# Patient Record
Sex: Female | Born: 1985 | Race: Black or African American | Hispanic: No | Marital: Married | Smoking: Never smoker
Health system: Southern US, Community
[De-identification: ages and names within clinical notes are randomized; demographics above are authoritative.]

## PROBLEM LIST (undated history)

## (undated) ENCOUNTER — Inpatient Hospital Stay (HOSPITAL_COMMUNITY): Payer: Self-pay

## (undated) DIAGNOSIS — N390 Urinary tract infection, site not specified: Secondary | ICD-10-CM

## (undated) DIAGNOSIS — F329 Major depressive disorder, single episode, unspecified: Secondary | ICD-10-CM

## (undated) DIAGNOSIS — N9489 Other specified conditions associated with female genital organs and menstrual cycle: Secondary | ICD-10-CM

## (undated) DIAGNOSIS — Z789 Other specified health status: Secondary | ICD-10-CM

## (undated) DIAGNOSIS — D649 Anemia, unspecified: Secondary | ICD-10-CM

## (undated) DIAGNOSIS — F419 Anxiety disorder, unspecified: Secondary | ICD-10-CM

## (undated) DIAGNOSIS — R42 Dizziness and giddiness: Secondary | ICD-10-CM

## (undated) DIAGNOSIS — F32A Depression, unspecified: Secondary | ICD-10-CM

## (undated) HISTORY — DX: Urinary tract infection, site not specified: N39.0

## (undated) HISTORY — PX: NO PAST SURGERIES: SHX2092

## (undated) HISTORY — DX: Depression, unspecified: F32.A

## (undated) HISTORY — DX: Dizziness and giddiness: R42

## (undated) HISTORY — DX: Anemia, unspecified: D64.9

## (undated) HISTORY — DX: Anxiety disorder, unspecified: F41.9

---
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
## 2013-09-06 ENCOUNTER — Emergency Department (HOSPITAL_COMMUNITY)
Admission: EM | Admit: 2013-09-06 | Discharge: 2013-09-07 | Disposition: A | Discharge status: Home / Self Care | Payer: Medicaid Other | Attending: Emergency Medicine | Admitting: Emergency Medicine

## 2013-09-06 ENCOUNTER — Emergency Department (HOSPITAL_COMMUNITY): Payer: Medicaid Other

## 2013-09-06 ENCOUNTER — Encounter (HOSPITAL_COMMUNITY): Payer: Self-pay | Admitting: Emergency Medicine

## 2013-09-06 DIAGNOSIS — O9989 Other specified diseases and conditions complicating pregnancy, childbirth and the puerperium: Secondary | ICD-10-CM | POA: Insufficient documentation

## 2013-09-06 DIAGNOSIS — R0602 Shortness of breath: Secondary | ICD-10-CM | POA: Insufficient documentation

## 2013-09-06 DIAGNOSIS — K219 Gastro-esophageal reflux disease without esophagitis: Secondary | ICD-10-CM | POA: Insufficient documentation

## 2013-09-06 DIAGNOSIS — R079 Chest pain, unspecified: Secondary | ICD-10-CM | POA: Insufficient documentation

## 2013-09-06 LAB — CBC
HCT: 35.1 % — ABNORMAL LOW (ref 36.0–46.0)
Hemoglobin: 12.1 g/dL (ref 12.0–15.0)
MCH: 30.4 pg (ref 26.0–34.0)
MCHC: 34.5 g/dL (ref 30.0–36.0)
MCV: 88.2 fL (ref 78.0–100.0)
PLATELETS: 228 10*3/uL (ref 150–400)
RBC: 3.98 MIL/uL (ref 3.87–5.11)
RDW: 11.9 % (ref 11.5–15.5)
WBC: 6.5 10*3/uL (ref 4.0–10.5)

## 2013-09-06 LAB — URINALYSIS, ROUTINE W REFLEX MICROSCOPIC
Bilirubin Urine: NEGATIVE
GLUCOSE, UA: NEGATIVE mg/dL
Hgb urine dipstick: NEGATIVE
KETONES UR: NEGATIVE mg/dL
Nitrite: NEGATIVE
Protein, ur: NEGATIVE mg/dL
Specific Gravity, Urine: 1.02 (ref 1.005–1.030)
Urobilinogen, UA: 1 mg/dL (ref 0.0–1.0)
pH: 7 (ref 5.0–8.0)

## 2013-09-06 LAB — BASIC METABOLIC PANEL
BUN: 9 mg/dL (ref 6–23)
CALCIUM: 9.7 mg/dL (ref 8.4–10.5)
CO2: 22 meq/L (ref 19–32)
CREATININE: 0.62 mg/dL (ref 0.50–1.10)
Chloride: 103 mEq/L (ref 96–112)
GFR calc non Af Amer: >90 mL/min (ref >90)
Glucose, Bld: 86 mg/dL (ref 70–99)
Potassium: 3.8 mEq/L (ref 3.7–5.3)
Sodium: 138 mEq/L (ref 137–147)

## 2013-09-06 LAB — LIPASE, BLOOD: Lipase: 26 U/L (ref 11–59)

## 2013-09-06 LAB — HEPATIC FUNCTION PANEL
ALT: 13 U/L (ref 0–35)
AST: 15 U/L (ref 0–37)
Albumin: 3.7 g/dL (ref 3.5–5.2)
Alkaline Phosphatase: 36 U/L — ABNORMAL LOW (ref 39–117)
Total Bilirubin: 0.3 mg/dL (ref 0.3–1.2)
Total Protein: 7 g/dL (ref 6.0–8.3)

## 2013-09-06 LAB — URINE MICROSCOPIC-ADD ON

## 2013-09-06 LAB — I-STAT TROPONIN, ED: Troponin i, poc: 0 ng/mL (ref 0.00–0.08)

## 2013-09-06 LAB — POC URINE PREG, ED: Preg Test, Ur: POSITIVE — AB

## 2013-09-06 MED ORDER — ASPIRIN 81 MG PO CHEW: 324.0000 mg | CHEWABLE_TABLET | Freq: Once | ORAL | Status: DC

## 2013-09-06 NOTE — ED Provider Notes (Signed)
CSN: [id number]     Arrival date & time 09/06/13  2111 History   First MD Initiated Contact with Patient 09/06/13 2132     Chief Complaint  Patient presents with  . Chest Pain    Patient is a 28 y.o. female presenting with chest pain. The history is provided by the patient.  Chest Pain Pain location:  Substernal area Pain quality: sharp   Pain radiates to:  L arm Pain severity:  Moderate Onset quality:  Sudden Duration:  2 hours Timing:  Constant Context: breathing   Relieved by:  Nothing Worsened by:  Nothing tried Ineffective treatments:  None tried Associated symptoms: shortness of breath   Associated symptoms: no abdominal pain, no cough and no fever   Risk factors: no birth control, no hypertension, no prior DVT/PE and no surgery   Pt has had a recent home pregnancy test that was positive.  History reviewed. No pertinent past medical history. History reviewed. No pertinent past surgical history. No family history on file. History  Substance Use Topics  . Smoking status: Never Smoker   . Smokeless tobacco: Never Used  . Alcohol Use: Yes     Comment: wine   OB History   Grav Para Term Preterm Abortions TAB SAB Ect Mult Living                 Review of Systems  Constitutional: Negative for fever.  Respiratory: Positive for shortness of breath. Negative for cough.   Cardiovascular: Positive for chest pain.  Gastrointestinal: Negative for abdominal pain.  All other systems reviewed and are negative.     Allergies  Review of patient's allergies indicates not on file.  Home Medications   Prior to Admission medications   Not on File   BP 95/59  Pulse 89  Resp 15  SpO2 100%  LMP 08/04/2013 Physical Exam  Nursing note and vitals reviewed. Constitutional: She appears well-developed and well-nourished. No distress.  HENT:  Head: Normocephalic and atraumatic.  Right Ear: External ear normal.  Left Ear: External ear normal.  Eyes: Conjunctivae are normal.  Right eye exhibits no discharge. Left eye exhibits no discharge. No scleral icterus.  Neck: Neck supple. No tracheal deviation present.  Cardiovascular: Normal rate, regular rhythm and intact distal pulses.   Pulmonary/Chest: Effort normal and breath sounds normal. No stridor. No respiratory distress. She has no wheezes. She has no rales. She exhibits tenderness.  Abdominal: Soft. Bowel sounds are normal. She exhibits no distension. There is tenderness in the epigastric area. There is no rebound and no guarding.  mild  Musculoskeletal: She exhibits no edema and no tenderness.  Neurological: She is alert. She has normal strength. No cranial nerve deficit (no facial droop, extraocular movements intact, no slurred speech) or sensory deficit. She exhibits normal muscle tone. She displays no seizure activity. Coordination normal.  Skin: Skin is warm and dry. No rash noted.  Psychiatric: She has a normal mood and affect.    ED Course  Procedures (including critical care time) Labs Review Labs Reviewed  CBC  BASIC METABOLIC PANEL  I-STAT TROPOININ, ED  POC URINE PREG, ED    Imaging Review No results found.   EKG Interpretation   Date/Time:  Wednesday Sep 06 2013 21:21:37 EDT Ventricular Rate:  93 PR Interval:  117 QRS Duration: 70 QT Interval:  350 QTC Calculation: 435 R Axis:   80 Text Interpretation:  Sinus rhythm Borderline short PR interval No  previous tracing Confirmed by Colston Pyle  MD-J, Jaken Fregia ([id number]) on 09/06/2013  9:33:10 PM      MDM   Final diagnoses:  Chest pain  GERD (gastroesophageal reflux disease)    Mild ttp in chest and epigastrum.  ?GERD.  Doubt PE, cardiac etiology.  Will dc home with antacids.  Follow up with PCP   Celene KrasJon R Keyshon Stein, MD 09/07/13 [phone]

## 2013-09-06 NOTE — ED Notes (Signed)
Bed: WA18 Expected date:  Expected time:  Means of arrival:  Comments: Triage 1 

## 2013-09-06 NOTE — ED Notes (Signed)
Pt states she is pregnant.

## 2013-09-06 NOTE — ED Notes (Signed)
Pt states started having mid sternal to L sided chest pain around 1930 tonight, radiation to L arm, states also having shortness of breath, denies any other symptoms.

## 2013-09-07 MED ORDER — FAMOTIDINE 20 MG PO TABS: 20.0000 mg | ORAL_TABLET | Freq: Two times a day (BID) | ORAL | Status: DC

## 2013-09-07 NOTE — Discharge Instructions (Signed)
Chest Pain (Nonspecific) °It is often hard to give a specific diagnosis for the cause of chest pain. There is always a chance that your pain could be related to something serious, such as a heart attack or a blood clot in the lungs. You need to follow up with your caregiver for further evaluation. °CAUSES  °· Heartburn. °· Pneumonia or bronchitis. °· Anxiety or stress. °· Inflammation around your heart (pericarditis) or lung (pleuritis or pleurisy). °· A blood clot in the lung. °· A collapsed lung (pneumothorax). It can develop suddenly on its own (spontaneous pneumothorax) or from injury (trauma) to the chest. °· Shingles infection (herpes zoster virus). °The chest wall is composed of bones, muscles, and cartilage. Any of these can be the source of the pain. °· The bones can be bruised by injury. °· The muscles or cartilage can be strained by coughing or overwork. °· The cartilage can be affected by inflammation and become sore (costochondritis). °DIAGNOSIS  °Lab tests or other studies, such as X-rays, electrocardiography, stress testing, or cardiac imaging, may be needed to find the cause of your pain.  °TREATMENT  °· Treatment depends on what may be causing your chest pain. Treatment may include: °· Acid blockers for heartburn. °· Anti-inflammatory medicine. °· Pain medicine for inflammatory conditions. °· Antibiotics if an infection is present. °· You may be advised to change lifestyle habits. This includes stopping smoking and avoiding alcohol, caffeine, and chocolate. °· You may be advised to keep your head raised (elevated) when sleeping. This reduces the chance of acid going backward from your stomach into your esophagus. °· Most of the time, nonspecific chest pain will improve within 2 to 3 days with rest and mild pain medicine. °HOME CARE INSTRUCTIONS  °· If antibiotics were prescribed, take your antibiotics as directed. Finish them even if you start to feel better. °· For the next few days, avoid physical  activities that bring on chest pain. Continue physical activities as directed. °· Do not smoke. °· Avoid drinking alcohol. °· Only take over-the-counter or prescription medicine for pain, discomfort, or fever as directed by your caregiver. °· Follow your caregiver's suggestions for further testing if your chest pain does not go away. °· Keep any follow-up appointments you made. If you do not go to an appointment, you could develop lasting (chronic) problems with pain. If there is any problem keeping an appointment, you must call to reschedule. °SEEK MEDICAL CARE IF:  °· You think you are having problems from the medicine you are taking. Read your medicine instructions carefully. °· Your chest pain does not go away, even after treatment. °· You develop a rash with blisters on your chest. °SEEK IMMEDIATE MEDICAL CARE IF:  °· You have increased chest pain or pain that spreads to your arm, neck, jaw, back, or abdomen. °· You develop shortness of breath, an increasing cough, or you are coughing up blood. °· You have severe back or abdominal pain, feel nauseous, or vomit. °· You develop severe weakness, fainting, or chills. °· You have a fever. °THIS IS AN EMERGENCY. Do not wait to see if the pain will go away. Get medical help at once. Call your local emergency services (911 in [place] Do not drive yourself to the hospital. °MAKE SURE YOU:  °· Understand these instructions. °· Will watch your condition. °· Will get help right away if you are not doing well or get worse. °Document Released: 01/21/2005 Document Revised: 07/06/2011 Document Reviewed: 11/17/2007 °ExitCare® Patient Information ©2014 [employer]   [employer]. ° °

## 2014-02-19 ENCOUNTER — Encounter (HOSPITAL_COMMUNITY): Payer: Self-pay | Admitting: General Practice

## 2014-02-19 ENCOUNTER — Inpatient Hospital Stay (HOSPITAL_COMMUNITY)
Admission: AD | Admit: 2014-02-19 | Discharge: 2014-02-19 | Disposition: A | Discharge status: Home / Self Care | Payer: Medicaid Other | Source: Ambulatory Visit | Attending: Obstetrics and Gynecology | Admitting: Obstetrics and Gynecology

## 2014-02-19 ENCOUNTER — Inpatient Hospital Stay (HOSPITAL_COMMUNITY): Payer: Medicaid Other

## 2014-02-19 DIAGNOSIS — O26899 Other specified pregnancy related conditions, unspecified trimester: Secondary | ICD-10-CM

## 2014-02-19 DIAGNOSIS — O418X10 Other specified disorders of amniotic fluid and membranes, first trimester, not applicable or unspecified: Secondary | ICD-10-CM

## 2014-02-19 DIAGNOSIS — O9989 Other specified diseases and conditions complicating pregnancy, childbirth and the puerperium: Secondary | ICD-10-CM

## 2014-02-19 DIAGNOSIS — R109 Unspecified abdominal pain: Secondary | ICD-10-CM | POA: Insufficient documentation

## 2014-02-19 DIAGNOSIS — O368910 Maternal care for other specified fetal problems, first trimester, not applicable or unspecified: Secondary | ICD-10-CM | POA: Diagnosis not present

## 2014-02-19 DIAGNOSIS — O468X1 Other antepartum hemorrhage, first trimester: Secondary | ICD-10-CM

## 2014-02-19 HISTORY — DX: Other specified health status: Z78.9

## 2014-02-19 LAB — WET PREP, GENITAL
Clue Cells Wet Prep HPF POC: NONE SEEN
TRICH WET PREP: NONE SEEN
YEAST WET PREP: NONE SEEN

## 2014-02-19 LAB — CBC
HCT: 33.9 % — ABNORMAL LOW (ref 36.0–46.0)
Hemoglobin: 11.8 g/dL — ABNORMAL LOW (ref 12.0–15.0)
MCH: 30.6 pg (ref 26.0–34.0)
MCHC: 34.8 g/dL (ref 30.0–36.0)
MCV: 87.8 fL (ref 78.0–100.0)
PLATELETS: 210 10*3/uL (ref 150–400)
RBC: 3.86 MIL/uL — AB (ref 3.87–5.11)
RDW: 12.2 % (ref 11.5–15.5)
WBC: 7 10*3/uL (ref 4.0–10.5)

## 2014-02-19 LAB — URINALYSIS, ROUTINE W REFLEX MICROSCOPIC
BILIRUBIN URINE: NEGATIVE
Glucose, UA: NEGATIVE mg/dL
Hgb urine dipstick: NEGATIVE
Ketones, ur: NEGATIVE mg/dL
LEUKOCYTES UA: NEGATIVE
NITRITE: NEGATIVE
PH: 8.5 — AB (ref 5.0–8.0)
Protein, ur: NEGATIVE mg/dL
Specific Gravity, Urine: 1.02 (ref 1.005–1.030)
Urobilinogen, UA: 0.2 mg/dL (ref 0.0–1.0)

## 2014-02-19 LAB — ABO/RH: ABO/RH(D): A NEG

## 2014-02-19 LAB — HCG, QUANTITATIVE, PREGNANCY: HCG, BETA CHAIN, QUANT, S: 186300 m[IU]/mL — AB (ref <5)

## 2014-02-19 LAB — POCT PREGNANCY, URINE: PREG TEST UR: POSITIVE — AB

## 2014-02-19 NOTE — MAU Provider Note (Signed)
History     CSN: [id number]  Arrival date and time: 02/19/14 1734   First Provider Initiated Contact with Patient 02/19/14 1852      Chief Complaint  Patient presents with  . Abdominal Pain   HPI  Ms. Michele Arias is a 28 y.o.female G3P1011 at [redacted]w[redacted]d who presents with left sided abdominal pain that started yesterday; the pain is constant today. She is scheduled on Oct 23 to see Janene Harvey She denies vaginal bleeding.   OB History   Grav Para Term Preterm Abortions TAB SAB Ect Mult Living   3 1 1  1 1    1       Past Medical History  Diagnosis Date  . Medical history non-contributory     Past Surgical History  Procedure Laterality Date  . No past surgeries      History reviewed. No pertinent family history.  History  Substance Use Topics  . Smoking status: Never Smoker   . Smokeless tobacco: Never Used  . Alcohol Use: Yes     Comment: wine    Allergies: No Known Allergies  Prescriptions prior to admission  Medication Sig Dispense Refill  . acetaminophen (TYLENOL) 325 MG tablet Take 650 mg by mouth every 6 (six) hours as needed for headache.      . prenatal vitamin w/FE, FA (NATACHEW) 29-1 MG CHEW chewable tablet Chew 1 tablet by mouth daily at 12 noon.      . [DISCONTINUED] aspirin-acetaminophen-caffeine (EXCEDRIN MIGRAINE) 250-250-65 MG per tablet Take 1 tablet by mouth every 6 (six) hours as needed for headache.      . [DISCONTINUED] famotidine (PEPCID) 20 MG tablet Take 1 tablet (20 mg total) by mouth 2 (two) times daily.  14 tablet  0   Results for orders placed during the hospital encounter of 02/19/14 (from the past 48 hour(s))  URINALYSIS, ROUTINE W REFLEX MICROSCOPIC     Status: Abnormal   Collection Time    02/19/14  5:50 PM      Result Value Ref Range   Color, Urine YELLOW  YELLOW   APPearance CLEAR  CLEAR   Specific Gravity, Urine 1.020  1.005 - 1.030   pH 8.5 (*) 5.0 - 8.0   Glucose, UA NEGATIVE  NEGATIVE mg/dL   Hgb urine dipstick  NEGATIVE  NEGATIVE   Bilirubin Urine NEGATIVE  NEGATIVE   Ketones, ur NEGATIVE  NEGATIVE mg/dL   Protein, ur NEGATIVE  NEGATIVE mg/dL   Urobilinogen, UA 0.2  0.0 - 1.0 mg/dL   Nitrite NEGATIVE  NEGATIVE   Leukocytes, UA NEGATIVE  NEGATIVE   Comment: MICROSCOPIC NOT DONE ON URINES WITH NEGATIVE PROTEIN, BLOOD, LEUKOCYTES, NITRITE, OR GLUCOSE <1000 mg/dL.  POCT PREGNANCY, URINE     Status: Abnormal   Collection Time    02/19/14  6:15 PM      Result Value Ref Range   Preg Test, Ur POSITIVE (*) NEGATIVE   Comment:            THE SENSITIVITY OF THIS     METHODOLOGY IS >24 mIU/mL  ABO/RH     Status: None   Collection Time    02/19/14  6:30 PM      Result Value Ref Range   ABO/RH(D) A NEG    CBC     Status: Abnormal   Collection Time    02/19/14  6:52 PM      Result Value Ref Range   WBC 7.0  4.0 - 10.5 K/uL  RBC 3.86 (*) 3.87 - 5.11 MIL/uL   Hemoglobin 11.8 (*) 12.0 - 15.0 g/dL   HCT 14.7 (*) 82.9 - 56.2 %   MCV 87.8  78.0 - 100.0 fL   MCH 30.6  26.0 - 34.0 pg   MCHC 34.8  30.0 - 36.0 g/dL   RDW 13.0  86.5 - 78.4 %   Platelets 210  150 - 400 K/uL  HCG, QUANTITATIVE, PREGNANCY     Status: Abnormal   Collection Time    02/19/14  6:52 PM      Result Value Ref Range   hCG, Beta Chain, Quant, S [id number] (*) <5 mIU/mL   Comment:              GEST. AGE      CONC.  (mIU/mL)       <=1 WEEK        5 - 50         2 WEEKS       50 - 500         3 WEEKS       100 - 10,000         4 WEEKS     1,000 - 30,000         5 WEEKS     3,500 - 115,000       6-8 WEEKS     12,000 - 270,000        12 WEEKS     15,000 - 220,000                FEMALE AND NON-PREGNANT FEMALE:         LESS THAN 5 mIU/mL  WET PREP, GENITAL     Status: Abnormal   Collection Time    02/19/14  6:58 PM      Result Value Ref Range   Yeast Wet Prep HPF POC NONE SEEN  NONE SEEN   Trich, Wet Prep NONE SEEN  NONE SEEN   Clue Cells Wet Prep HPF POC NONE SEEN  NONE SEEN   WBC, Wet Prep HPF POC MANY (*) NONE SEEN   Comment:  MANY BACTERIA SEEN    Review of Systems  Constitutional: Negative for fever and chills.  Gastrointestinal: Positive for nausea and diarrhea. Negative for vomiting and constipation.  Genitourinary: Negative for dysuria, urgency, frequency and hematuria.       + vaginal discharge; white, no odor  No vaginal bleeding. No dysuria.    Physical Exam   Blood pressure 110/66, pulse 94, temperature 98.2 F (36.8 C), temperature source Oral, resp. rate 18, height 5\' 5"  (1.651 m), weight 52.527 kg (115 lb 12.8 oz), last menstrual period 12/27/2013.  Physical Exam  Constitutional: She is oriented to person, place, and time. She appears well-developed and well-nourished. No distress.  HENT:  Head: Normocephalic.  Eyes: Pupils are equal, round, and reactive to light.  Neck: Neck supple.  Respiratory: Effort normal.  GI: Normal appearance. There is tenderness in the suprapubic area. There is no rigidity and no guarding.  Genitourinary:  Speculum exam: Vagina - Small amount of creamy discharge, no odor Cervix - No contact bleeding Bimanual exam: Cervix closed Uterus non tender, normal size Adnexa non tender, no masses bilaterally, +suprapubic tenderness  GC/Chlam, wet prep done Chaperone present for exam.   Musculoskeletal: Normal range of motion.  Neurological: She is alert and oriented to person, place, and time.  Skin: She is not diaphoretic.  Psychiatric: Her behavior is normal.  MAU Course  Procedures None  MDM CBC ABO Beta hcg US  UA UPT  1940: patient awaiting US.  A negative blood type; denies vaginal bleeding Report given to Thressa ShellerHeather Opel Lejeune CNM who resumes care of this patient.   Results for orders placed during the hospital encounter of 02/19/14 (from the past 24 hour(s))  URINALYSIS, ROUTINE W REFLEX MICROSCOPIC     Status: Abnormal   Collection Time    02/19/14  5:50 PM      Result Value Ref Range   Color, Urine YELLOW  YELLOW   APPearance CLEAR  CLEAR    Specific Gravity, Urine 1.020  1.005 - 1.030   pH 8.5 (*) 5.0 - 8.0   Glucose, UA NEGATIVE  NEGATIVE mg/dL   Hgb urine dipstick NEGATIVE  NEGATIVE   Bilirubin Urine NEGATIVE  NEGATIVE   Ketones, ur NEGATIVE  NEGATIVE mg/dL   Protein, ur NEGATIVE  NEGATIVE mg/dL   Urobilinogen, UA 0.2  0.0 - 1.0 mg/dL   Nitrite NEGATIVE  NEGATIVE   Leukocytes, UA NEGATIVE  NEGATIVE  POCT PREGNANCY, URINE     Status: Abnormal   Collection Time    02/19/14  6:15 PM      Result Value Ref Range   Preg Test, Ur POSITIVE (*) NEGATIVE  ABO/RH     Status: None   Collection Time    02/19/14  6:30 PM      Result Value Ref Range   ABO/RH(D) A NEG    CBC     Status: Abnormal   Collection Time    02/19/14  6:52 PM      Result Value Ref Range   WBC 7.0  4.0 - 10.5 K/uL   RBC 3.86 (*) 3.87 - 5.11 MIL/uL   Hemoglobin 11.8 (*) 12.0 - 15.0 g/dL   HCT 21.333.9 (*) 08.636.0 - 57.846.0 %   MCV 87.8  78.0 - 100.0 fL   MCH 30.6  26.0 - 34.0 pg   MCHC 34.8  30.0 - 36.0 g/dL   RDW 46.912.2  62.911.5 - 52.815.5 %   Platelets 210  150 - 400 K/uL  HCG, QUANTITATIVE, PREGNANCY     Status: Abnormal   Collection Time    02/19/14  6:52 PM      Result Value Ref Range   hCG, Beta Chain, Quant, [place] [id number] (*) <5 mIU/mL  WET PREP, GENITAL     Status: Abnormal   Collection Time    02/19/14  6:58 PM      Result Value Ref Range   Yeast Wet Prep HPF POC NONE SEEN  NONE SEEN   Trich, Wet Prep NONE SEEN  NONE SEEN   Clue Cells Wet Prep HPF POC NONE SEEN  NONE SEEN   WBC, Wet Prep HPF POC MANY (*) NONE SEEN   [place] Ob Comp Less 14 Wks  02/19/2014   CLINICAL DATA:  Left lower quadrant abdominal pain, pregnancy.  EXAM: OBSTETRIC <14 WK US AND TRANSVAGINAL OB US  TECHNIQUE: Both transabdominal and transvaginal ultrasound examinations were performed for complete evaluation of the gestation as well as the maternal uterus, adnexal regions, and pelvic cul-de-sac. Transvaginal technique was performed to assess early pregnancy.  COMPARISON:  None.  FINDINGS:  Intrauterine gestational sac: Visualized/normal in shape.  Yolk sac:  Visualized.  Embryo:  Visualized.  Cardiac Activity: Visualized.  Heart Rate:  168 bpm  CRL:   17.6  mm   8 w 2 d  US EDC: September 29, 2014.  Maternal uterus/adnexae: Ovaries appear normal. No free fluid is noted. Moderate size subchorionic hemorrhage is noted.  IMPRESSION: Single live intrauterine gestation of 8 weeks 2 days. Moderate size subchorionic hemorrhage is present.   Electronically Signed   By: Roque LiasJames  Green M.D.   On: 02/19/2014 21:00   [place] Ob Transvaginal  02/19/2014   CLINICAL DATA:  Left lower quadrant abdominal pain, pregnancy.  EXAM: OBSTETRIC <14 WK US AND TRANSVAGINAL OB US  TECHNIQUE: Both transabdominal and transvaginal ultrasound examinations were performed for complete evaluation of the gestation as well as the maternal uterus, adnexal regions, and pelvic cul-de-sac. Transvaginal technique was performed to assess early pregnancy.  COMPARISON:  None.  FINDINGS: Intrauterine gestational sac: Visualized/normal in shape.  Yolk sac:  Visualized.  Embryo:  Visualized.  Cardiac Activity: Visualized.  Heart Rate:  168 bpm  CRL:   17.6  mm   8 w 2 d                  US EDC: September 29, 2014.  Maternal uterus/adnexae: Ovaries appear normal. No free fluid is noted. Moderate size subchorionic hemorrhage is noted.  IMPRESSION: Single live intrauterine gestation of 8 weeks 2 days. Moderate size subchorionic hemorrhage is present.   Electronically Signed   By: Roque LiasJames  Green M.D.   On: 02/19/2014 21:00     Assessment and Plan   1. Abdominal pain in pregnancy   2. Subchorionic hematoma in first trimester    First trimester precautions  Bleeding precautions  [facility]   Michele Arias, Michele Arias  Michele Irene Rasch, NP 02/19/2014 8:06 PM

## 2014-02-19 NOTE — MAU Note (Signed)
Pt presents to mau for c/o left sided abdominal pain. Pt states she is [redacted] weeks pregnant

## 2014-02-19 NOTE — Discharge Instructions (Signed)

## 2014-02-20 LAB — GC/CHLAMYDIA PROBE AMP
CT Probe RNA: NEGATIVE
GC Probe RNA: NEGATIVE

## 2014-02-20 LAB — HIV ANTIBODY (ROUTINE TESTING W REFLEX): HIV 1&2 Ab, 4th Generation: NONREACTIVE

## 2014-02-20 NOTE — MAU Provider Note (Signed)
Attestation of Attending Supervision of Advanced Practitioner (CNM/NP): Evaluation and management procedures were performed by the Advanced Practitioner under my supervision and collaboration.  I have reviewed the Advanced Practitioner's note and chart, and I agree with the management and plan.  Breydan Shillingburg 02/20/2014 6:56 AM

## 2014-02-26 ENCOUNTER — Encounter (HOSPITAL_COMMUNITY): Payer: Self-pay | Admitting: General Practice

## 2014-03-19 ENCOUNTER — Encounter: Payer: Medicaid Other | Admitting: Obstetrics & Gynecology

## 2014-04-03 LAB — OB RESULTS CONSOLE ANTIBODY SCREEN: ANTIBODY SCREEN: NEGATIVE

## 2014-04-03 LAB — OB RESULTS CONSOLE RUBELLA ANTIBODY, IGM: Rubella: IMMUNE

## 2014-04-03 LAB — OB RESULTS CONSOLE ABO/RH: RH Type: NEGATIVE

## 2014-04-03 LAB — OB RESULTS CONSOLE HIV ANTIBODY (ROUTINE TESTING): HIV: NONREACTIVE

## 2014-04-03 LAB — OB RESULTS CONSOLE RPR: RPR: NONREACTIVE

## 2014-04-03 LAB — OB RESULTS CONSOLE HEPATITIS B SURFACE ANTIGEN: Hepatitis B Surface Ag: NEGATIVE

## 2014-04-05 ENCOUNTER — Other Ambulatory Visit (HOSPITAL_COMMUNITY): Payer: Self-pay | Admitting: Obstetrics

## 2014-04-23 ENCOUNTER — Encounter: Payer: Self-pay | Admitting: *Deleted

## 2014-04-24 ENCOUNTER — Encounter: Payer: Self-pay | Admitting: Obstetrics & Gynecology

## 2014-04-27 NOTE — L&D Delivery Note (Signed)
Delivery Note At 7:40 AM a viable female was delivered via Vaginal, Spontaneous Delivery (Presentation: Left Occiput Anterior).  APGAR: , ; weight  .   Placenta status: Intact, Spontaneous.  Cord: 3 vessels with the following complications: None.  Cord pH: not done  Anesthesia: Epidural  Episiotomy: None Lacerations: 1st degree;Vaginal Suture Repair: 2.0 vicryl Est. Blood Loss (mL):    Mom to postpartum.  Baby to Couplet care / Skin to Skin.  Michele Arias A 09/21/2014, 7:49 AM

## 2014-07-03 LAB — OB RESULTS CONSOLE RPR: RPR: NONREACTIVE

## 2014-09-06 LAB — OB RESULTS CONSOLE GC/CHLAMYDIA
Chlamydia: NEGATIVE
GC PROBE AMP, GENITAL: NEGATIVE

## 2014-09-06 LAB — OB RESULTS CONSOLE GBS: GBS: POSITIVE

## 2014-09-21 ENCOUNTER — Inpatient Hospital Stay (HOSPITAL_COMMUNITY): Payer: Medicaid Other | Admitting: Anesthesiology

## 2014-09-21 ENCOUNTER — Encounter (HOSPITAL_COMMUNITY): Payer: Self-pay | Admitting: *Deleted

## 2014-09-21 ENCOUNTER — Inpatient Hospital Stay (HOSPITAL_COMMUNITY)
Admission: AD | Admit: 2014-09-21 | Discharge: 2014-09-23 | DRG: 775 | Disposition: A | Discharge status: Home / Self Care | Payer: Medicaid Other | Source: Ambulatory Visit | Attending: Obstetrics | Admitting: Obstetrics

## 2014-09-21 DIAGNOSIS — Z22330 Carrier of Group B streptococcus: Secondary | ICD-10-CM

## 2014-09-21 DIAGNOSIS — Z3A38 38 weeks gestation of pregnancy: Secondary | ICD-10-CM | POA: Diagnosis present

## 2014-09-21 DIAGNOSIS — IMO0001 Reserved for inherently not codable concepts without codable children: Secondary | ICD-10-CM

## 2014-09-21 LAB — RPR: RPR Ser Ql: NONREACTIVE

## 2014-09-21 LAB — CBC
HEMATOCRIT: 35.4 % — AB (ref 36.0–46.0)
Hemoglobin: 12.2 g/dL (ref 12.0–15.0)
MCH: 31.3 pg (ref 26.0–34.0)
MCHC: 34.5 g/dL (ref 30.0–36.0)
MCV: 90.8 fL (ref 78.0–100.0)
Platelets: 187 10*3/uL (ref 150–400)
RBC: 3.9 MIL/uL (ref 3.87–5.11)
RDW: 14 % (ref 11.5–15.5)
WBC: 5.4 10*3/uL (ref 4.0–10.5)

## 2014-09-21 LAB — TYPE AND SCREEN
ABO/RH(D): A NEG
Antibody Screen: NEGATIVE

## 2014-09-21 MED ORDER — OXYTOCIN 40 UNITS IN LACTATED RINGERS INFUSION - SIMPLE MED
62.5000 mL/h | INTRAVENOUS | Status: DC
  Administered 2014-09-21: 999 mL/h via INTRAVENOUS
  Filled 2014-09-21: qty 1000

## 2014-09-21 MED ORDER — CITRIC ACID-SODIUM CITRATE 334-500 MG/5ML PO SOLN
30.0000 mL | ORAL | Status: DC | PRN
Start: 2014-09-21 — End: 2014-09-21

## 2014-09-21 MED ORDER — BENZOCAINE-MENTHOL 20-0.5 % EX AERO
1.0000 "application " | INHALATION_SPRAY | CUTANEOUS | Status: DC | PRN
  Filled 2014-09-21: qty 56

## 2014-09-21 MED ORDER — DIPHENHYDRAMINE HCL 50 MG/ML IJ SOLN: 12.5000 mg | INTRAMUSCULAR | Status: DC | PRN

## 2014-09-21 MED ORDER — OXYCODONE-ACETAMINOPHEN 5-325 MG PO TABS: 2.0000 | ORAL_TABLET | ORAL | Status: DC | PRN

## 2014-09-21 MED ORDER — ACETAMINOPHEN 325 MG PO TABS: 650.0000 mg | ORAL_TABLET | ORAL | Status: DC | PRN

## 2014-09-21 MED ORDER — SODIUM BICARBONATE 8.4 % IV SOLN
INTRAVENOUS | Status: DC | PRN
  Administered 2014-09-21: 4 mL via EPIDURAL

## 2014-09-21 MED ORDER — PRENATAL MULTIVITAMIN CH
1.0000 | ORAL_TABLET | Freq: Every day | ORAL | Status: DC
  Administered 2014-09-21 – 2014-09-22 (×2): 1 via ORAL
  Filled 2014-09-21 (×2): qty 1

## 2014-09-21 MED ORDER — SENNOSIDES-DOCUSATE SODIUM 8.6-50 MG PO TABS
2.0000 | ORAL_TABLET | ORAL | Status: DC
  Administered 2014-09-22 – 2014-09-23 (×2): 2 via ORAL
  Filled 2014-09-21 (×2): qty 2

## 2014-09-21 MED ORDER — TETANUS-DIPHTH-ACELL PERTUSSIS 5-2.5-18.5 LF-MCG/0.5 IM SUSP: 0.5000 mL | Freq: Once | INTRAMUSCULAR | Status: DC

## 2014-09-21 MED ORDER — FENTANYL 2.5 MCG/ML BUPIVACAINE 1/10 % EPIDURAL INFUSION (WH - ANES)
14.0000 mL/h | INTRAMUSCULAR | Status: DC | PRN
  Administered 2014-09-21: 14 mL/h via EPIDURAL
  Administered 2014-09-21: 12 mL/h via EPIDURAL
  Filled 2014-09-21: qty 125

## 2014-09-21 MED ORDER — ONDANSETRON HCL 4 MG/2ML IJ SOLN: 4.0000 mg | Freq: Four times a day (QID) | INTRAMUSCULAR | Status: DC | PRN

## 2014-09-21 MED ORDER — PENICILLIN G POTASSIUM 5000000 UNITS IJ SOLR
5.0000 10*6.[IU] | Freq: Once | INTRAVENOUS | Status: DC
  Filled 2014-09-21: qty 5

## 2014-09-21 MED ORDER — OXYCODONE-ACETAMINOPHEN 5-325 MG PO TABS
1.0000 | ORAL_TABLET | ORAL | Status: DC | PRN
  Filled 2014-09-21: qty 1

## 2014-09-21 MED ORDER — ONDANSETRON HCL 4 MG PO TABS
4.0000 mg | ORAL_TABLET | ORAL | Status: DC | PRN
Start: 2014-09-21 — End: 2014-09-23

## 2014-09-21 MED ORDER — LACTATED RINGERS IV SOLN: 500.0000 mL | INTRAVENOUS | Status: DC | PRN

## 2014-09-21 MED ORDER — PHENYLEPHRINE 40 MCG/ML (10ML) SYRINGE FOR IV PUSH (FOR BLOOD PRESSURE SUPPORT)
80.0000 ug | PREFILLED_SYRINGE | INTRAVENOUS | Status: AC | PRN
  Administered 2014-09-21 (×3): 80 ug via INTRAVENOUS
  Filled 2014-09-21: qty 20

## 2014-09-21 MED ORDER — PENICILLIN G POTASSIUM 5000000 UNITS IJ SOLR
2.5000 10*6.[IU] | INTRAVENOUS | Status: DC
  Filled 2014-09-21 (×3): qty 2.5

## 2014-09-21 MED ORDER — ONDANSETRON HCL 4 MG/2ML IJ SOLN
4.0000 mg | INTRAMUSCULAR | Status: DC | PRN
Start: 2014-09-21 — End: 2014-09-23

## 2014-09-21 MED ORDER — LIDOCAINE HCL (PF) 1 % IJ SOLN
30.0000 mL | INTRAMUSCULAR | Status: DC | PRN
  Filled 2014-09-21: qty 30

## 2014-09-21 MED ORDER — DIPHENHYDRAMINE HCL 25 MG PO CAPS: 25.0000 mg | ORAL_CAPSULE | Freq: Four times a day (QID) | ORAL | Status: DC | PRN

## 2014-09-21 MED ORDER — FERROUS SULFATE 325 (65 FE) MG PO TABS
325.0000 mg | ORAL_TABLET | Freq: Two times a day (BID) | ORAL | Status: DC
  Administered 2014-09-21 – 2014-09-23 (×5): 325 mg via ORAL
  Filled 2014-09-21 (×6): qty 1

## 2014-09-21 MED ORDER — SIMETHICONE 80 MG PO CHEW: 80.0000 mg | CHEWABLE_TABLET | ORAL | Status: DC | PRN

## 2014-09-21 MED ORDER — OXYCODONE-ACETAMINOPHEN 5-325 MG PO TABS
1.0000 | ORAL_TABLET | ORAL | Status: DC | PRN
  Administered 2014-09-22: 1 via ORAL

## 2014-09-21 MED ORDER — DIBUCAINE 1 % RE OINT
1.0000 | TOPICAL_OINTMENT | RECTAL | Status: DC | PRN
Start: 2014-09-21 — End: 2014-09-23
  Filled 2014-09-21: qty 28

## 2014-09-21 MED ORDER — BUPIVACAINE HCL (PF) 0.25 % IJ SOLN
INTRAMUSCULAR | Status: DC | PRN
  Administered 2014-09-21 (×2): 4 mL

## 2014-09-21 MED ORDER — EPHEDRINE 5 MG/ML INJ: 10.0000 mg | INTRAVENOUS | Status: DC | PRN

## 2014-09-21 MED ORDER — WITCH HAZEL-GLYCERIN EX PADS: 1.0000 "application " | MEDICATED_PAD | CUTANEOUS | Status: DC | PRN

## 2014-09-21 MED ORDER — ZOLPIDEM TARTRATE 5 MG PO TABS: 5.0000 mg | ORAL_TABLET | Freq: Every evening | ORAL | Status: DC | PRN

## 2014-09-21 MED ORDER — LIDOCAINE-EPINEPHRINE (PF) 2 %-1:200000 IJ SOLN
INTRAMUSCULAR | Status: DC | PRN
  Administered 2014-09-21: 5 mL

## 2014-09-21 MED ORDER — LANOLIN HYDROUS EX OINT: TOPICAL_OINTMENT | CUTANEOUS | Status: DC | PRN

## 2014-09-21 MED ORDER — OXYTOCIN BOLUS FROM INFUSION: 500.0000 mL | INTRAVENOUS | Status: DC

## 2014-09-21 MED ORDER — IBUPROFEN 600 MG PO TABS
600.0000 mg | ORAL_TABLET | Freq: Four times a day (QID) | ORAL | Status: DC
  Administered 2014-09-21 – 2014-09-23 (×8): 600 mg via ORAL
  Filled 2014-09-21 (×8): qty 1

## 2014-09-21 MED ORDER — FLEET ENEMA 7-19 GM/118ML RE ENEM: 1.0000 | ENEMA | RECTAL | Status: DC | PRN

## 2014-09-21 MED ORDER — LACTATED RINGERS IV SOLN
INTRAVENOUS | Status: DC
  Administered 2014-09-21: 125 mL via INTRAVENOUS

## 2014-09-21 NOTE — Lactation Note (Signed)
This note was copied from the chart of Michele Roselie SkinnerCarlisha Purkey. Lactation Consultation Note  Patient Name: Michele Arias ZOXWR'UToday's Date: 09/21/2014 Reason for consult: Initial assessment  Visited with Mom, baby 8 hrs old.  Baby held by visitor, showing feeding cues.  Offered assistance with latch, and Mom accepted.  Mom has small breasts with erect nipple on left side.  Right side is erect, but has a dimple on tip.  Mom does not like to use the right side.  Gave her a manual pump and showed her how to pre-pump prior to latching.  If baby doesn't latch, recommended about 10 minutes of pumping.  Mom states she has sensitive breasts, and manual expression is painful.  Assisted Mom in using cross cradle, which a U hold sandwiching breast.  Baby allowed to latch onto nipple by Mom.  Took baby off, and showed her how to elicit a wide open gape of mouth prior to latching.  Assisted her in placing her hands further back on breast to allow baby to latch deeper.  Baby tends to slip onto nipple base.  Taught Mom how to [place] baby and relatch her deeper.  Swallows noted.  Encouraged her to exclusively breast feed her often on cue skin to skin.   Brochure left in room.  Explained about IP and OP lactation services available to her.  Encouraged her to call her RN for assistance as needed.  To follow up in am.  Consult Status Consult Status: Follow-up Date: 09/22/14 Follow-up type: In-patient    Michele Arias, Michele Arias 09/21/2014, 4:29 PM

## 2014-09-21 NOTE — MAU Note (Signed)
PT  SAYS SHE STARTED  HURTING    BAD  AT 0130.  NO VE.    DENIES HSV AND MRSA.    GBS-   POSITIVE

## 2014-09-21 NOTE — H&P (Signed)
This is Dr. Francoise CeoBernard Willona Phariss dictating the history and physical on  Michele Arias she is a 29 year old gravida 3 para [id number] at 38 weeks and 2 days positive GBS got penicillin and she 6 cm membranes intact in active labor Her past medical history was negative Past surgical history was negative Social history denies smoking drinking and alcohol use Family history noncontributory System review HEENT negative Denies shortness of breath Denies chest pain Otherwise negative Physical exam well-developed female in labor HEENT negative Lungs clear to P&A Heart regular rhythm no murmurs no gallops Abdomen term Pelvic as described above Extremities negative

## 2014-09-21 NOTE — Anesthesia Postprocedure Evaluation (Signed)
  Anesthesia Post-op Note  Patient: Michele Arias  Procedure(s) Performed: * No procedures listed *  Patient Location: Mother/Baby  Anesthesia Type:Epidural  Level of Consciousness: awake  Airway and Oxygen Therapy: Patient Spontanous Breathing  Post-op Pain: mild  Post-op Assessment: Patient's Cardiovascular Status Stable and Respiratory Function Stable  Post-op Vital Signs: stable  Last Vitals:  Filed Vitals:   09/21/14 1300  BP: 107/67  Pulse: 84  Temp: 36.8 C  Resp: 16    Complications: No apparent anesthesia complications

## 2014-09-21 NOTE — Anesthesia Procedure Notes (Signed)
Epidural Patient location during procedure: OB  Staffing Anesthesiologist: Blaike Vickers, CHRIS Performed by: anesthesiologist   Preanesthetic Checklist Completed: patient identified, surgical consent, pre-op evaluation, timeout performed, IV checked, risks and benefits discussed and monitors and equipment checked  Epidural Patient position: sitting Prep: site prepped and draped and DuraPrep Patient monitoring: heart rate, cardiac monitor, continuous pulse ox and blood pressure Approach: midline Location: L4-L5 Injection technique: LOR saline  Needle:  Needle type: Tuohy  Needle gauge: 17 G Needle length: 9 cm Needle insertion depth: 6 cm Catheter type: closed end flexible Catheter size: 19 Gauge Catheter at skin depth: 12 cm Test dose: negative and 2% lidocaine with Epi 1:200 K  Assessment Events: blood not aspirated, injection not painful, no injection resistance, negative IV test and no paresthesia  Additional Notes H+P and labs checked, risks and benefits discussed with the patient, consent obtained, procedure tolerated well and without complications.  Reason for block:procedure for pain   

## 2014-09-21 NOTE — Anesthesia Preprocedure Evaluation (Signed)
Anesthesia Evaluation  Patient identified by MRN, date of birth, ID band Patient awake    Reviewed: Allergy & Precautions, NPO status , Patient's Chart, lab work & pertinent test results  History of Anesthesia Complications Negative for: history of anesthetic complications  Airway Mallampati: II  TM Distance: >3 FB Neck ROM: Full    Dental  (+) Teeth Intact   Pulmonary neg pulmonary ROS,  breath sounds clear to auscultation        Cardiovascular negative cardio ROS  Rhythm:Regular     Neuro/Psych negative neurological ROS  negative psych ROS   GI/Hepatic negative GI ROS, Neg liver ROS,   Endo/Other  negative endocrine ROS  Renal/GU negative Renal ROS     Musculoskeletal   Abdominal   Peds  Hematology  (+) anemia ,   Anesthesia Other Findings   Reproductive/Obstetrics (+) Pregnancy                             Anesthesia Physical Anesthesia Plan  ASA: II  Anesthesia Plan: Epidural   Post-op Pain Management:    Induction:   Airway Management Planned:   Additional Equipment:   Intra-op Plan:   Post-operative Plan:   Informed Consent: I have reviewed the patients History and Physical, chart, labs and discussed the procedure including the risks, benefits and alternatives for the proposed anesthesia with the patient or authorized representative who has indicated his/her understanding and acceptance.     Plan Discussed with: Anesthesiologist  Anesthesia Plan Comments:         Anesthesia Quick Evaluation  

## 2014-09-21 NOTE — Progress Notes (Signed)
Pt not symptomatic in regards to BP

## 2014-09-21 NOTE — Progress Notes (Signed)
Pt not symptomatic of low BP

## 2014-09-22 LAB — KLEIHAUER-BETKE STAIN
# Vials RhIg: 1
Fetal Cells %: 0.2 %
QUANTITATION FETAL HEMOGLOBIN: 10 mL

## 2014-09-22 LAB — CBC
HEMATOCRIT: 31.5 % — AB (ref 36.0–46.0)
Hemoglobin: 10.8 g/dL — ABNORMAL LOW (ref 12.0–15.0)
MCH: 31.3 pg (ref 26.0–34.0)
MCHC: 34.3 g/dL (ref 30.0–36.0)
MCV: 91.3 fL (ref 78.0–100.0)
PLATELETS: 175 10*3/uL (ref 150–400)
RBC: 3.45 MIL/uL — ABNORMAL LOW (ref 3.87–5.11)
RDW: 14.1 % (ref 11.5–15.5)
WBC: 8.3 10*3/uL (ref 4.0–10.5)

## 2014-09-22 MED ORDER — RHO D IMMUNE GLOBULIN 1500 UNIT/2ML IJ SOSY
300.0000 ug | PREFILLED_SYRINGE | Freq: Once | INTRAMUSCULAR | Status: AC
  Administered 2014-09-23: 300 ug via INTRAMUSCULAR
  Filled 2014-09-22: qty 2

## 2014-09-22 NOTE — Progress Notes (Signed)
Patient ID: Michele SkinnerCarlisha Arias, female   DOB: [birth year], 29 y.o.   MRN: [id number] Postpartum day one Vital signs blood pressure 04/28/1963 respirations 16 pulse 93 afebrile Patient has no complaints Fundus firm lochia moderate legs negative doing well

## 2014-09-23 NOTE — Discharge Instructions (Signed)
Discharge instructions   You can wash your hair  Shower  Eat what you want  Drink what you want  See me in 6 weeks  Your ankles are going to swell more in the next 2 weeks than when pregnant  No sex for 6 weeks   Jullianna Gabor A, MD 09/23/2014

## 2014-09-23 NOTE — Progress Notes (Signed)
Patient ID: Roselie SkinnerCarlisha Mena, female   DOB: 1985/10/23, 29 y.o.   MRN: [id number] Postpartum day 2 Blood pressure 90 with 60 respiration 16 pulse 87 afebrile Fundus firm Lochia moderate Legs negative Doing well Has no complaints home today

## 2014-09-23 NOTE — Discharge Summary (Signed)
Obstetric Discharge Summary Reason for Admission: onset of labor Prenatal Procedures: none Intrapartum Procedures: spontaneous vaginal delivery Postpartum Procedures: none Complications-Operative and Postpartum: none HEMOGLOBIN  Date Value Ref Range Status  09/22/2014 10.8* 12.0 - 15.0 g/dL Final   HCT  Date Value Ref Range Status  09/22/2014 31.5* 36.0 - 46.0 % Final    Physical Exam:  General: alert Lochia: appropriate Uterine Fundus: firm Incision: healing well DVT Evaluation: No evidence of DVT seen on physical exam.  Discharge Diagnoses: Term Pregnancy-delivered  Discharge Information: Date: 09/23/2014 Activity: pelvic rest Diet: routine Medications: Percocet Condition: improved Instructions: refer to practice specific booklet Discharge to: home Follow-up Information    Follow up with Kathreen CosierMARSHALL,BERNARD A, MD.   Specialty:  Obstetrics and Gynecology   Contact information:   [address] [place] [address] [phone]       Newborn Data: Live born female  Birth Weight: 6 lb 12.3 oz (3070 g) APGAR: 7, 9  Home with mother.  MARSHALL,BERNARD A 09/23/2014, 6:11 AM

## 2014-09-24 LAB — RH IG WORKUP (INCLUDES ABO/RH)
ABO/RH(D): A NEG
Gestational Age(Wks): 38.2
Unit division: 0

## 2016-01-04 LAB — CYTOLOGY - PAP

## 2016-11-10 ENCOUNTER — Encounter (HOSPITAL_COMMUNITY): Payer: Self-pay | Admitting: Family Medicine

## 2016-11-10 ENCOUNTER — Emergency Department (HOSPITAL_COMMUNITY)
Admission: EM | Admit: 2016-11-10 | Discharge: 2016-11-10 | Disposition: A | Discharge status: Home / Self Care | Payer: Medicaid Other | Attending: Emergency Medicine | Admitting: Emergency Medicine

## 2016-11-10 DIAGNOSIS — M79604 Pain in right leg: Secondary | ICD-10-CM

## 2016-11-10 DIAGNOSIS — M79661 Pain in right lower leg: Secondary | ICD-10-CM | POA: Insufficient documentation

## 2016-11-10 LAB — CBC WITH DIFFERENTIAL/PLATELET
Basophils Absolute: 0 10*3/uL (ref 0.0–0.1)
Basophils Relative: 0 %
EOS PCT: 5 %
Eosinophils Absolute: 0.3 10*3/uL (ref 0.0–0.7)
HCT: 35.5 % — ABNORMAL LOW (ref 36.0–46.0)
Hemoglobin: 12.3 g/dL (ref 12.0–15.0)
LYMPHS ABS: 2.4 10*3/uL (ref 0.7–4.0)
Lymphocytes Relative: 50 %
MCH: 30.7 pg (ref 26.0–34.0)
MCHC: 34.6 g/dL (ref 30.0–36.0)
MCV: 88.5 fL (ref 78.0–100.0)
MONO ABS: 0.2 10*3/uL (ref 0.1–1.0)
Monocytes Relative: 5 %
Neutro Abs: 2 10*3/uL (ref 1.7–7.7)
Neutrophils Relative %: 40 %
PLATELETS: 244 10*3/uL (ref 150–400)
RBC: 4.01 MIL/uL (ref 3.87–5.11)
RDW: 12.1 % (ref 11.5–15.5)
WBC: 4.9 10*3/uL (ref 4.0–10.5)

## 2016-11-10 LAB — BASIC METABOLIC PANEL
Anion gap: 8 (ref 5–15)
BUN: 17 mg/dL (ref 6–20)
CALCIUM: 9.6 mg/dL (ref 8.9–10.3)
CO2: 27 mmol/L (ref 22–32)
CREATININE: 0.89 mg/dL (ref 0.44–1.00)
Chloride: 107 mmol/L (ref 101–111)
GFR calc Af Amer: >60 mL/min (ref >60)
GLUCOSE: 110 mg/dL — AB (ref 65–99)
Potassium: 3.7 mmol/L (ref 3.5–5.1)
Sodium: 142 mmol/L (ref 135–145)

## 2016-11-10 LAB — D-DIMER, QUANTITATIVE: D-Dimer, Quant: <0.27 ug/mL-FEU (ref 0.00–0.50)

## 2016-11-10 MED ORDER — IBUPROFEN 800 MG PO TABS
800.0000 mg | ORAL_TABLET | Freq: Once | ORAL | Status: AC
  Administered 2016-11-10: 800 mg via ORAL
  Filled 2016-11-10: qty 1

## 2016-11-10 MED ORDER — IBUPROFEN 600 MG PO TABS: 600.0000 mg | ORAL_TABLET | Freq: Four times a day (QID) | ORAL | 0 refills | Status: DC | PRN

## 2016-11-10 MED ORDER — METHOCARBAMOL 500 MG PO TABS
500.0000 mg | ORAL_TABLET | Freq: Once | ORAL | Status: AC
  Administered 2016-11-10: 500 mg via ORAL
  Filled 2016-11-10: qty 1

## 2016-11-10 MED ORDER — METHOCARBAMOL 500 MG PO TABS: 500.0000 mg | ORAL_TABLET | Freq: Two times a day (BID) | ORAL | 0 refills | Status: DC

## 2016-11-10 NOTE — ED Notes (Addendum)
Pt is having right leg pain and states that she had tingling sensation in her toes and a knot feeling that she gets often just became unbearable constant pain. 7/10. Pt states symptoms started Sat.

## 2016-11-10 NOTE — ED Triage Notes (Signed)
Patient reports she is experenicing right leg pain that starts from her thigh to her calf. Pain started on Sunday. Denies any recent injury. Strong pedal pulse to the right leg. Skin is warm and dry. Cap Refill is less than 3 seconds.

## 2016-11-10 NOTE — Discharge Instructions (Signed)
Return if any problems.  See your Physician for recheck in 1 week   °

## 2016-11-11 NOTE — ED Provider Notes (Signed)
[facility]-EMERGENCY DEPT Provider Note   CSN: [id number] Arrival date & time: 11/10/16  1906     History   Chief Complaint Chief Complaint  Patient presents with  . Leg Pain    HPI Michele Arias is a 31 y.o. female.  The history is provided by the patient. No language interpreter was used.  Leg Pain   This is a recurrent problem. The current episode started more than 1 week ago. The problem occurs constantly. The problem has not changed since onset.The pain is present in the right upper leg. The pain is moderate. Pertinent negatives include full range of motion. She has tried nothing for the symptoms. The treatment provided no relief. There has been no history of extremity trauma. Family history is significant for no rheumatoid arthritis and no gout.  Pt complains of a tight sensation in right upper thigh and right leg.  Pt reports she has had a tight sensation in leg for years but seems more noticeable,  Pt concerned about diabetes,  No injury  Past Medical History:  Diagnosis Date  . Medical history non-contributory     Patient Active Problem List   Diagnosis Date Noted  . Active labor 09/21/2014  . NVD (normal vaginal delivery) 09/21/2014    Past Surgical History:  Procedure Laterality Date  . NO PAST SURGERIES      OB History    Gravida Para Term Preterm AB Living   3 2 2   1 2    SAB TAB Ectopic Multiple Live Births     1   0 2       Home Medications    Prior to Admission medications   Medication Sig Start Date End Date Taking? Authorizing Provider  ibuprofen (ADVIL,MOTRIN) 600 MG tablet Take 1 tablet (600 mg total) by mouth every 6 (six) hours as needed. 11/10/16   Elson AreasSofia, Arius Harnois K, PA-C  methocarbamol (ROBAXIN) 500 MG tablet Take 1 tablet (500 mg total) by mouth 2 (two) times daily. 11/10/16   Elson AreasSofia, Alexandrina Fiorini K, PA-C    Family History History reviewed. No pertinent family history.  Social History Social History  Substance Use Topics  . Smoking status:  Never Smoker  . Smokeless tobacco: Never Used  . Alcohol use No     Allergies   Patient has no known allergies.   Review of Systems Review of Systems  All other systems reviewed and are negative.    Physical Exam Updated Vital Signs BP 130/87 (BP Location: Left Arm)   Pulse 79   Temp 98.4 F (36.9 C) (Oral)   Resp 18   Ht 5\' 5"  (1.651 m)   Wt 59.9 kg (132 lb)   LMP 11/06/2016   SpO2 100%   BMI 21.97 kg/m   Physical Exam  Constitutional: She is oriented to person, place, and time. She appears well-developed and well-nourished.  Musculoskeletal: She exhibits tenderness. She exhibits no deformity.  Tender quadraceps,  Negative homan's  No swelling.  Normal gait.   Neurological: She is alert and oriented to person, place, and time.  Skin: Skin is warm.  Psychiatric: She has a normal mood and affect.  Nursing note and vitals reviewed.    ED Treatments / Results  Labs (all labs ordered are listed, but only abnormal results are displayed) Labs Reviewed  CBC WITH DIFFERENTIAL/PLATELET - Abnormal; Notable for the following:       Result Value   HCT 35.5 (*)    All other components within normal limits  BASIC METABOLIC PANEL - Abnormal; Notable for the following:    Glucose, Bld 110 (*)    All other components within normal limits  D-DIMER, QUANTITATIVE (NOT AT [facility])    EKG  EKG Interpretation None       Radiology No results found.  Procedures Procedures (including critical care time)  Medications Ordered in ED Medications  ibuprofen (ADVIL,MOTRIN) tablet 800 mg (800 mg Oral Given 11/10/16 2204)  methocarbamol (ROBAXIN) tablet 500 mg (500 mg Oral Given 11/10/16 2204)     Initial Impression / Assessment and Plan / ED Course  I have reviewed the triage vital signs and the nursing notes.  Pertinent labs & imaging results that were available during my care of the patient were reviewed by me and considered in my medical decision making (see chart for  details).     Labs no acute abnormality,  Pt advised to follow up with primary care MD.  I will try robaxin and ibuprofen.  Final Clinical Impressions(s) / ED Diagnoses   Final diagnoses:  Right leg pain    New Prescriptions Discharge Medication List as of 11/10/2016 10:17 PM    START taking these medications   Details  ibuprofen (ADVIL,MOTRIN) 600 MG tablet Take 1 tablet (600 mg total) by mouth every 6 (six) hours as needed., Starting Tue 11/10/2016, Print    methocarbamol (ROBAXIN) 500 MG tablet Take 1 tablet (500 mg total) by mouth 2 (two) times daily., Starting Tue 11/10/2016, Print      An After Visit Summary was printed and given to the patient.   Elson Areas, PA-C 11/11/16 0021    Nira Conn, MD 11/11/16 [phone]

## 2018-12-19 ENCOUNTER — Other Ambulatory Visit: Payer: Self-pay

## 2018-12-19 ENCOUNTER — Encounter: Payer: Self-pay | Admitting: Family Medicine

## 2018-12-19 ENCOUNTER — Ambulatory Visit (INDEPENDENT_AMBULATORY_CARE_PROVIDER_SITE_OTHER): Payer: Managed Care, Other (non HMO) | Admitting: Family Medicine

## 2018-12-19 VITALS — BP 104/68 | HR 76 | Temp 97.9°F | Ht 66.0 in | Wt 151.0 lb

## 2018-12-19 DIAGNOSIS — G4700 Insomnia, unspecified: Secondary | ICD-10-CM

## 2018-12-19 DIAGNOSIS — R6882 Decreased libido: Secondary | ICD-10-CM | POA: Diagnosis not present

## 2018-12-19 LAB — CBC WITH DIFFERENTIAL/PLATELET
Basophils Absolute: 0 10*3/uL (ref 0.0–0.1)
Basophils Relative: 0.6 % (ref 0.0–3.0)
Eosinophils Absolute: 0.2 10*3/uL (ref 0.0–0.7)
Eosinophils Relative: 3.9 % (ref 0.0–5.0)
HCT: 36.4 % (ref 36.0–46.0)
Hemoglobin: 12.5 g/dL (ref 12.0–15.0)
Lymphocytes Relative: 44.4 % (ref 12.0–46.0)
Lymphs Abs: 1.8 10*3/uL (ref 0.7–4.0)
MCHC: 34.3 g/dL (ref 30.0–36.0)
MCV: 90.6 fl (ref 78.0–100.0)
Monocytes Absolute: 0.3 10*3/uL (ref 0.1–1.0)
Monocytes Relative: 7.3 % (ref 3.0–12.0)
Neutro Abs: 1.7 10*3/uL (ref 1.4–7.7)
Neutrophils Relative %: 43.8 % (ref 43.0–77.0)
Platelets: 242 10*3/uL (ref 150.0–400.0)
RBC: 4.02 Mil/uL (ref 3.87–5.11)
RDW: 12.7 % (ref 11.5–15.5)
WBC: 4 10*3/uL (ref 4.0–10.5)

## 2018-12-19 LAB — COMPREHENSIVE METABOLIC PANEL
ALT: 17 U/L (ref 0–35)
AST: 15 U/L (ref 0–37)
Albumin: 4.3 g/dL (ref 3.5–5.2)
Alkaline Phosphatase: 40 U/L (ref 39–117)
BUN: 10 mg/dL (ref 6–23)
CO2: 25 mEq/L (ref 19–32)
Calcium: 9.2 mg/dL (ref 8.4–10.5)
Chloride: 104 mEq/L (ref 96–112)
Creatinine, Ser: 0.74 mg/dL (ref 0.40–1.20)
GFR: 109.31 mL/min (ref >60.00)
Glucose, Bld: 94 mg/dL (ref 70–99)
Potassium: 3.6 mEq/L (ref 3.5–5.1)
Sodium: 136 mEq/L (ref 135–145)
Total Bilirubin: 0.5 mg/dL (ref 0.2–1.2)
Total Protein: 7 g/dL (ref 6.0–8.3)

## 2018-12-19 NOTE — Progress Notes (Signed)
Patient: Michele Arias MRN: [id number] DOB: Dec 22, 1985 PCP: Orma Flaming, MD     Subjective:  Chief Complaint  Patient presents with  . Establish Care    HPI: The patient is a 33 y.o. female who presents today for establishing care. She has some issues she would like to discuss today. Her main issue is decreased sex drive. She states it is non existent. She first noticed this about 5 years ago. She has been married 8 months, but has been together for 10 years. They have 2 kids together. No hx of infidelity. She can not recall any precipitating factors for her decreased libido. She denies any relationship factors that would contribute to this. She has had no labs done for this. She takes no medication and denies any pain with sex.  They have sex about once/month. Before this started she would have sex about 4-5x/week.  She does have regular periods. Sometimes can be heavy for 1-2 days, but then normal flow. Last 7 days.   Insomnia: She has a hard time falling asleep. Doesn't fall asleep until 4-5am. She feels like she is anxious and is always checking on her kids. This has been going on a long time (9 years). On average she will get 4-5 hours/night. 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 No caffiene after 2pm, occasionally exercises, no naps. She gets outside daily. She does do screen time before bed. She sometimes watches TV before bed. Her kids go to bed at 8:30pm. She tries to go to bed at 9:30pm-10pm. She can not shut her mind off or worry about checking on her kids. Has not tried anything over the counter.   Review of Systems  Constitutional: Negative for chills, fatigue and fever.  HENT: Negative for dental problem, ear pain, hearing loss and trouble swallowing.   Eyes: Negative for visual disturbance.  Respiratory: Negative for cough, chest tightness and shortness of breath.   Cardiovascular: Negative for chest pain, palpitations and leg swelling.   Gastrointestinal: Negative for abdominal pain, blood in stool, diarrhea and nausea.  Endocrine: Negative for cold intolerance, polydipsia, polyphagia and polyuria.  Genitourinary: Negative for dysuria and hematuria.       Decreased sex drive   Musculoskeletal: Positive for back pain. Negative for arthralgias.  Skin: Negative for rash.  Neurological: Negative for dizziness and headaches.  Psychiatric/Behavioral: Positive for sleep disturbance. Negative for dysphoric mood. The patient is not nervous/anxious.     Allergies Patient has No Known Allergies.  Past Medical History Patient  has a past medical history of Medical history non-contributory.  Surgical History Patient  has a past surgical history that includes No past surgeries.  Family History Pateint's family history is not on file.  Social History Patient  reports that she has never smoked. She has never used smokeless tobacco. She reports current alcohol use. She reports that she does not use drugs.    Objective: Vitals:   12/19/18 1107  BP: 104/68  Pulse: 76  Temp: 97.9 F (36.6 C)  TempSrc: Skin  SpO2: 98%  Weight: 151 lb (68.5 kg)  Height: 5\' 6"  (1.676 m)    Body mass index is 24.37 kg/m.  Physical Exam Vitals signs reviewed.  Constitutional:      Appearance: Normal appearance. She is normal weight.  HENT:     Head: Normocephalic and atraumatic.     Right Ear: Tympanic membrane, ear canal and external ear normal.     Left Ear: Tympanic membrane, ear  canal and external ear normal.     Nose: Nose normal.  Neck:     Musculoskeletal: Normal range of motion and neck supple.  Cardiovascular:     Rate and Rhythm: Normal rate and regular rhythm.     Heart sounds: Normal heart sounds.  Pulmonary:     Effort: Pulmonary effort is normal.     Breath sounds: Normal breath sounds.  Abdominal:     General: Abdomen is flat. Bowel sounds are normal.     Palpations: Abdomen is soft.  Skin:    General: Skin is  warm and dry.  Neurological:     General: No focal deficit present.     Mental Status: She is alert and oriented to person, place, and time.  Psychiatric:        Mood and Affect: Mood normal.        Behavior: Behavior normal.    Depression screen [facility] 12/19/2018  Decreased Interest 0  Down, Depressed, Hopeless 0  PHQ - 2 Score 0       Assessment/plan: 1. Decreased libido without sexual dysfunction Checking labs and working on sleep. Has no clear cut reason for this (relationship/meds/etc) if labs normal and sleep doesn't help discussed would do trial of wellbutrin. She will also look into this.  - Comprehensive metabolic panel - CBC with Differential/Platelet - TSH  2. Insomnia, unspecified type Discussed sleep hygiene. She is hesitant to start any medication. Will do trial of sleep hygiene, ashwagandha and melatonin. If this doesn't work would start her on trazodone, but will hold off on this until we do conservative treatment first. Will see if this helps sexual dysfunction as well. F/u in 3 months.   Needs pap/annual and will f/u on this.     Return in about 3 months (around 03/21/2019).     Orland MustardAllison Aadya Kindler, MD [place] [facility]  12/19/2018

## 2018-12-19 NOTE — Addendum Note (Signed)
Addended by: Kevan Ny on: 12/19/2018 02:42 PM   Modules accepted: Orders

## 2018-12-19 NOTE — Patient Instructions (Signed)
Try ashwaghanda 300mg  about 30 min to 1 hour before bed. Can also try melatonin.     Insomnia Insomnia is a sleep disorder that makes it difficult to fall asleep or stay asleep. Insomnia can cause fatigue, low energy, difficulty concentrating, mood swings, and poor performance at work or school. There are three different ways to classify insomnia:  Difficulty falling asleep.  Difficulty staying asleep.  Waking up too early in the morning. Any type of insomnia can be long-term (chronic) or short-term (acute). Both are common. Short-term insomnia usually lasts for three months or less. Chronic insomnia occurs at least three times a week for longer than three months. What are the causes? Insomnia may be caused by another condition, situation, or substance, such as:  Anxiety.  Certain medicines.  Gastroesophageal reflux disease (GERD) or other gastrointestinal conditions.  Asthma or other breathing conditions.  Restless legs syndrome, sleep apnea, or other sleep disorders.  Chronic pain.  Menopause.  Stroke.  Abuse of alcohol, tobacco, or illegal drugs.  Mental health conditions, such as depression.  Caffeine.  Neurological disorders, such as Alzheimer's disease.  An overactive thyroid (hyperthyroidism). Sometimes, the cause of insomnia may not be known. What increases the risk? Risk factors for insomnia include:  Gender. Women are affected more often than men.  Age. Insomnia is more common as you get older.  Stress.  Lack of exercise.  Irregular work schedule or working night shifts.  Traveling between different time zones.  Certain medical and mental health conditions. What are the signs or symptoms? If you have insomnia, the main symptom is having trouble falling asleep or having trouble staying asleep. This may lead to other symptoms, such as:  Feeling fatigued or having low energy.  Feeling nervous about going to sleep.  Not feeling rested in the  morning.  Having trouble concentrating.  Feeling irritable, anxious, or depressed. How is this diagnosed? This condition may be diagnosed based on:  Your symptoms and medical history. Your health care provider may ask about: ? Your sleep habits. ? Any medical conditions you have. ? Your mental health.  A physical exam. How is this treated? Treatment for insomnia depends on the cause. Treatment may focus on treating an underlying condition that is causing insomnia. Treatment may also include:  Medicines to help you sleep.  Counseling or therapy.  Lifestyle adjustments to help you sleep better. Follow these instructions at home: Eating and drinking   Limit or avoid alcohol, caffeinated beverages, and cigarettes, especially close to bedtime. These can disrupt your sleep.  Do not eat a large meal or eat spicy foods right before bedtime. This can lead to digestive discomfort that can make it hard for you to sleep. Sleep habits   Keep a sleep diary to help you and your health care provider figure out what could be causing your insomnia. Write down: ? When you sleep. ? When you wake up during the night. ? How well you sleep. ? How rested you feel the next day. ? Any side effects of medicines you are taking. ? What you eat and drink.  Make your bedroom a dark, comfortable place where it is easy to fall asleep. ? Put up shades or blackout curtains to block light from outside. ? Use a white noise machine to block noise. ? Keep the temperature cool.  Limit screen use before bedtime. This includes: ? Watching TV. ? Using your smartphone, tablet, or computer.  Stick to a routine that includes going to bed  and waking up at the same times every day and night. This can help you fall asleep faster. Consider making a quiet activity, such as reading, part of your nighttime routine.  Try to avoid taking naps during the day so that you sleep better at night.  Get out of bed if you are  still awake after 15 minutes of trying to sleep. Keep the lights down, but try reading or doing a quiet activity. When you feel sleepy, go back to bed. General instructions  Take over-the-counter and prescription medicines only as told by your health care provider.  Exercise regularly, as told by your health care provider. Avoid exercise starting several hours before bedtime.  Use relaxation techniques to manage stress. Ask your health care provider to suggest some techniques that may work well for you. These may include: ? Breathing exercises. ? Routines to release muscle tension. ? Visualizing peaceful scenes.  Make sure that you drive carefully. Avoid driving if you feel very sleepy.  Keep all follow-up visits as told by your health care provider. This is important. Contact a health care provider if:  You are tired throughout the day.  You have trouble in your daily routine due to sleepiness.  You continue to have sleep problems, or your sleep problems get worse. Get help right away if:  You have serious thoughts about hurting yourself or someone else. If you ever feel like you may hurt yourself or others, or have thoughts about taking your own life, get help right away. You can go to your nearest emergency department or call:  Your local emergency services (911 in the [place]  A suicide crisis helpline, such as the [facility] at 218-062-6291. This is open 24 hours a day. Summary  Insomnia is a sleep disorder that makes it difficult to fall asleep or stay asleep.  Insomnia can be long-term (chronic) or short-term (acute).  Treatment for insomnia depends on the cause. Treatment may focus on treating an underlying condition that is causing insomnia.  Keep a sleep diary to help you and your health care provider figure out what could be causing your insomnia. This information is not intended to replace advice given to you by your health care provider.  Make sure you discuss any questions you have with your health care provider. Document Released: 04/10/2000 Document Revised: 03/26/2017 Document Reviewed: 01/21/2017 Elsevier Patient Education  2020 [employer].

## 2018-12-20 LAB — TSH: TSH: 1.49 u[IU]/mL (ref 0.35–4.50)

## 2018-12-21 ENCOUNTER — Encounter: Payer: Self-pay | Admitting: Family Medicine

## 2018-12-23 NOTE — Telephone Encounter (Signed)
See note  Copied from [place] [id number]. Topic: General - Inquiry >> Dec 23, 2018  4:34 PM Mathis Bud wrote: Reason for CRM: Patient called asking to speak to PCP, patient would not disclose any other information Call back [phone]

## 2018-12-29 ENCOUNTER — Telehealth: Payer: Self-pay | Admitting: Physical Therapy

## 2018-12-29 NOTE — Telephone Encounter (Signed)
Copied from [place] [id number]. Topic: General - Other >> Dec 29, 2018 11:57 AM Ivar Drape wrote: Reason for CRM:  Patient was told by Dr. Rogers Blocker to let her know her thoughts on the medication she suggested for the patient.  Patient would like a return call from Dr. Rogers Blocker because she doesn't know how to respond to her mychart message.

## 2018-12-30 MED ORDER — BUPROPION HCL ER (XL) 150 MG PO TB24: 150.0000 mg | ORAL_TABLET | Freq: Every day | ORAL | 2 refills | Status: DC

## 2018-12-30 NOTE — Addendum Note (Signed)
Addended by: Orma Flaming on: 12/30/2018 12:23 PM   Modules accepted: Orders

## 2018-12-30 NOTE — Telephone Encounter (Signed)
Called patient. We are going to start wellbutrin for sexual dysfunction. Starting at 150mg  XR. Most studies found increase in arousal at the 300mg  dosage, but would like to start low and see if we see any benefits. F/u in 1-3 months.  Orma Flaming, MD [place]

## 2019-03-13 ENCOUNTER — Ambulatory Visit (INDEPENDENT_AMBULATORY_CARE_PROVIDER_SITE_OTHER): Payer: Managed Care, Other (non HMO) | Admitting: Family Medicine

## 2019-03-13 ENCOUNTER — Encounter: Payer: Self-pay | Admitting: Family Medicine

## 2019-03-13 ENCOUNTER — Other Ambulatory Visit: Payer: Self-pay

## 2019-03-13 VITALS — BP 110/64 | HR 75 | Temp 98.0°F | Ht 66.0 in | Wt 145.4 lb

## 2019-03-13 DIAGNOSIS — F5101 Primary insomnia: Secondary | ICD-10-CM | POA: Diagnosis not present

## 2019-03-13 DIAGNOSIS — R6882 Decreased libido: Secondary | ICD-10-CM

## 2019-03-13 NOTE — Patient Instructions (Signed)
See dr. Talbert Nan for sexual issues and pap smear, they should call you with this appointment.   Keep up the ashwagandha. im so glad it's working well for you!  Let me know if you need anything! Otherwise f/u for annual next year.

## 2019-03-13 NOTE — Progress Notes (Signed)
Patient: Michele Arias MRN: [id number] DOB: July 18, 1985 PCP: Orma Flaming, MD     Subjective:  Chief Complaint  Patient presents with  . sexual dysfunction  . Insomnia    HPI: The patient is a 33 y.o. female who presents today for follow up on sexual dysfunction. I started her on wellbutrin at our last visit. She took it less than 2 months and noticed that it made her appetite decreased and upset her stomach so she stopped taking this. She does not think it helped her sex drive at all.   Insomnia: She is ashwagandha and this has really helped her a lot. She states it calms her body down and is happy with the outcome. This has not helped with her sexual dysfunction at all.   Review of Systems  Constitutional: Positive for appetite change. Negative for fatigue.       Pt states that she has noticed decrease in appettite x 3 wks.  Stopped taking her Wellbutrin x 2 wks ago approximately.  Eyes: Negative for visual disturbance.  Respiratory: Negative for shortness of breath.   Cardiovascular: Negative for chest pain, palpitations and leg swelling.  Gastrointestinal: Negative for constipation, diarrhea, nausea and vomiting.  Skin: Negative for rash.  Neurological: Positive for headaches. Negative for dizziness.  Psychiatric/Behavioral: Negative for sleep disturbance.    Allergies Patient has No Known Allergies.  Past Medical History Patient  has a past medical history of Depression, Medical history non-contributory, and UTI (urinary tract infection).  Surgical History Patient  has a past surgical history that includes No past surgeries.  Family History Pateint's family history includes Alcohol abuse in her brother, brother, father, and mother; Cancer in her maternal grandmother and paternal grandmother; Depression in her brother and mother; Drug abuse in her brother, mother, and sister; Hypertension in her maternal grandmother and mother; Mental illness in her brother, mother,  and sister; Miscarriages / Stillbirths in her sister; Stroke in her maternal grandmother.  Social History Patient  reports that she has never smoked. She has never used smokeless tobacco. She reports current alcohol use. She reports that she does not use drugs.    Objective: Vitals:   03/13/19 1043  BP: 110/64  Pulse: 75  Temp: 98 F (36.7 C)  TempSrc: Skin  SpO2: 98%  Weight: 145 lb 6.4 oz (66 kg)  Height: 5\' 6"  (1.676 m)    Body mass index is 23.47 kg/m.  Physical Exam Vitals signs reviewed.  Constitutional:      Appearance: Normal appearance. She is normal weight.  HENT:     Head: Normocephalic and atraumatic.  Eyes:     Extraocular Movements: Extraocular movements intact.     Pupils: Pupils are equal, round, and reactive to light.  Cardiovascular:     Rate and Rhythm: Normal rate and regular rhythm.     Heart sounds: Normal heart sounds.  Pulmonary:     Effort: Pulmonary effort is normal.     Breath sounds: Normal breath sounds.  Abdominal:     General: Abdomen is flat. Bowel sounds are normal.     Palpations: Abdomen is soft.  Neurological:     General: No focal deficit present.     Mental Status: She is alert and oriented to person, place, and time.  Psychiatric:        Mood and Affect: Mood normal.        Behavior: Behavior normal.        Assessment/plan: 1. Decreased libido without sexual dysfunction Failed  wellbutrin. Discussed I do not normally prescribe anything else and would be worth seeing gyn for possibly new medication. Also due for pap smear and can f/u with dr. Oscar La for this.   2. Primary insomnia Doing great on ashwagandha. Continue this and sleep hygiene. Happy this is working for her.   Will get flu shot at her work. tdap in her last pregnancy.    Return if symptoms worsen or fail to improve.   Orland Mustard, MD  [facility]   03/13/2019

## 2019-03-21 ENCOUNTER — Encounter: Payer: Self-pay | Admitting: Obstetrics and Gynecology

## 2019-04-10 ENCOUNTER — Other Ambulatory Visit: Payer: Self-pay

## 2019-04-11 ENCOUNTER — Ambulatory Visit: Payer: Managed Care, Other (non HMO) | Admitting: Obstetrics and Gynecology

## 2019-04-25 ENCOUNTER — Other Ambulatory Visit: Payer: Self-pay

## 2019-04-25 ENCOUNTER — Ambulatory Visit (INDEPENDENT_AMBULATORY_CARE_PROVIDER_SITE_OTHER): Payer: Managed Care, Other (non HMO) | Admitting: Obstetrics and Gynecology

## 2019-04-25 ENCOUNTER — Encounter: Payer: Self-pay | Admitting: Obstetrics and Gynecology

## 2019-04-25 VITALS — BP 118/70 | HR 76 | Temp 98.1°F | Ht 66.0 in | Wt 153.4 lb

## 2019-04-25 DIAGNOSIS — F419 Anxiety disorder, unspecified: Secondary | ICD-10-CM

## 2019-04-25 DIAGNOSIS — F5231 Female orgasmic disorder: Secondary | ICD-10-CM | POA: Diagnosis not present

## 2019-04-25 DIAGNOSIS — Z01419 Encounter for gynecological examination (general) (routine) without abnormal findings: Secondary | ICD-10-CM | POA: Diagnosis not present

## 2019-04-25 DIAGNOSIS — R6882 Decreased libido: Secondary | ICD-10-CM | POA: Diagnosis not present

## 2019-04-25 DIAGNOSIS — N941 Unspecified dyspareunia: Secondary | ICD-10-CM

## 2019-04-25 DIAGNOSIS — IMO0002 Reserved for concepts with insufficient information to code with codable children: Secondary | ICD-10-CM

## 2019-04-25 DIAGNOSIS — Z124 Encounter for screening for malignant neoplasm of cervix: Secondary | ICD-10-CM | POA: Diagnosis not present

## 2019-04-25 DIAGNOSIS — M6289 Other specified disorders of muscle: Secondary | ICD-10-CM
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
## 2019-04-25 NOTE — Patient Instructions (Signed)
Therapist Rogene Houston [phone]  EXERCISE AND DIET:  We recommended that you start or continue a regular exercise program for good health. Regular exercise means any activity that makes your heart beat faster and makes you sweat.  We recommend exercising at least 30 minutes per day at least 3 days a week, preferably 4 or 5.  We also recommend a diet low in fat and sugar.  Inactivity, poor dietary choices and obesity can cause diabetes, heart attack, stroke, and kidney damage, among others.    ALCOHOL AND SMOKING:  Women should limit their alcohol intake to no more than 7 drinks/beers/glasses of wine (combined, not each!) per week. Moderation of alcohol intake to this level decreases your risk of breast cancer and liver damage. And of course, no recreational drugs are part of a healthy lifestyle.  And absolutely no smoking or even second hand smoke. Most people know smoking can cause heart and lung diseases, but did you know it also contributes to weakening of your bones? Aging of your skin?  Yellowing of your teeth and nails?  CALCIUM AND VITAMIN D:  Adequate intake of calcium and Vitamin D are recommended.  The recommendations for exact amounts of these supplements seem to change often, but generally speaking 1,000 mg of calcium (between diet and supplement) and 800 units of Vitamin D per day seems prudent. Certain women may benefit from higher intake of Vitamin D.  If you are among these women, your doctor will have told you during your visit.    PAP SMEARS:  Pap smears, to check for cervical cancer or precancers,  have traditionally been done yearly, although recent scientific advances have shown that most women can have pap smears less often.  However, every woman still should have a physical exam from her gynecologist every year. It will include a breast check, inspection of the vulva and vagina to check for abnormal growths or skin changes, a visual exam of the cervix, and then an  exam to evaluate the size and shape of the uterus and ovaries.  And after 33 years of age, a rectal exam is indicated to check for rectal cancers. We will also provide age appropriate advice regarding health maintenance, like when you should have certain vaccines, screening for sexually transmitted diseases, bone density testing, colonoscopy, mammograms, etc.   MAMMOGRAMS:  All women over 59 years old should have a yearly mammogram. Many facilities now offer a "3D" mammogram, which may cost around $50 extra out of pocket. If possible,  we recommend you accept the option to have the 3D mammogram performed.  It both reduces the number of women who will be called back for extra views which then turn out to be normal, and it is better than the routine mammogram at detecting truly abnormal areas.    COLON CANCER SCREENING: Now recommend starting at age 62. At this time colonoscopy is not covered for routine screening until 50. There are take home tests that can be done between 45-49.   COLONOSCOPY:  Colonoscopy to screen for colon cancer is recommended for all women at age 52.  We know, you hate the idea of the prep.  We agree, BUT, having colon cancer and not knowing it is worse!!  Colon cancer so often starts as a polyp that can be seen and removed at colonscopy, which can quite literally save your life!  And if your first colonoscopy is normal and you have no family history of colon cancer, most women don't  have to have it again for 10 years.  Once every ten years, you can do something that may end up saving your life, right?  We will be happy to help you get it scheduled when you are ready.  Be sure to check your insurance coverage so you understand how much it will cost.  It may be covered as a preventative service at no cost, but you should check your particular policy.      Breast Self-Awareness Breast self-awareness means being familiar with how your breasts look and feel. It involves checking your  breasts regularly and reporting any changes to your health care provider. Practicing breast self-awareness is important. A change in your breasts can be a sign of a serious medical problem. Being familiar with how your breasts look and feel allows you to find any problems early, when treatment is more likely to be successful. All women should practice breast self-awareness, including women who have had breast implants. How to do a breast self-exam One way to learn what is normal for your breasts and whether your breasts are changing is to do a breast self-exam. To do a breast self-exam: Look for Changes  1. Remove all the clothing above your waist. 2. Stand in front of a mirror in a room with good lighting. 3. Put your hands on your hips. 4. Push your hands firmly downward. 5. Compare your breasts in the mirror. Look for differences between them (asymmetry), such as: ? Differences in shape. ? Differences in size. ? Puckers, dips, and bumps in one breast and not the other. 6. Look at each breast for changes in your skin, such as: ? Redness. ? Scaly areas. 7. Look for changes in your nipples, such as: ? Discharge. ? Bleeding. ? Dimpling. ? Redness. ? A change in position. Feel for Changes Carefully feel your breasts for lumps and changes. It is best to do this while lying on your back on the floor and again while sitting or standing in the shower or tub with soapy water on your skin. Feel each breast in the following way:  Place the arm on the side of the breast you are examining above your head.  Feel your breast with the other hand.  Start in the nipple area and make  inch (2 cm) overlapping circles to feel your breast. Use the pads of your three middle fingers to do this. Apply light pressure, then medium pressure, then firm pressure. The light pressure will allow you to feel the tissue closest to the skin. The medium pressure will allow you to feel the tissue that is a little deeper.  The firm pressure will allow you to feel the tissue close to the ribs.  Continue the overlapping circles, moving downward over the breast until you feel your ribs below your breast.  Move one finger-width toward the center of the body. Continue to use the  inch (2 cm) overlapping circles to feel your breast as you move slowly up toward your collarbone.  Continue the up and down exam using all three pressures until you reach your armpit.  Write Down What You Find  Write down what is normal for each breast and any changes that you find. Keep a written record with breast changes or normal findings for each breast. By writing this information down, you do not need to depend only on memory for size, tenderness, or location. Write down where you are in your menstrual cycle, if you are still menstruating. If  you are having trouble noticing differences in your breasts, do not get discouraged. With time you will become more familiar with the variations in your breasts and more comfortable with the exam. How often should I examine my breasts? Examine your breasts every month. If you are breastfeeding, the best time to examine your breasts is after a feeding or after using a breast pump. If you menstruate, the best time to examine your breasts is 5-7 days after your period is over. During your period, your breasts are lumpier, and it may be more difficult to notice changes. When should I see my health care provider? See your health care provider if you notice:  A change in shape or size of your breasts or nipples.  A change in the skin of your breast or nipples, such as a reddened or scaly area.  Unusual discharge from your nipples.  A lump or thick area that was not there before.  Pain in your breasts.  Anything that concerns you.  

## 2019-04-26 ENCOUNTER — Other Ambulatory Visit (HOSPITAL_COMMUNITY)
Admission: RE | Admit: 2019-04-26 | Discharge: 2019-04-26 | Disposition: A | Discharge status: Home / Self Care | Payer: Managed Care, Other (non HMO) | Source: Ambulatory Visit | Attending: Obstetrics and Gynecology | Admitting: Obstetrics and Gynecology

## 2019-04-26 DIAGNOSIS — Z124 Encounter for screening for malignant neoplasm of cervix: Secondary | ICD-10-CM | POA: Insufficient documentation

## 2019-04-26 NOTE — Addendum Note (Signed)
Addended by: Dorothy Spark on: 04/26/2019 02:28 PM   Modules accepted: Orders

## 2019-04-27 LAB — CYTOLOGY - PAP
Comment: NEGATIVE
Diagnosis: NEGATIVE
High risk HPV: NEGATIVE

## 2019-05-02 ENCOUNTER — Other Ambulatory Visit: Payer: Self-pay | Admitting: Obstetrics and Gynecology

## 2019-05-02 DIAGNOSIS — M6289 Other specified disorders of muscle: Secondary | ICD-10-CM

## 2019-05-02 DIAGNOSIS — N941 Unspecified dyspareunia: Secondary | ICD-10-CM

## 2019-05-03 ENCOUNTER — Other Ambulatory Visit: Payer: Self-pay

## 2019-05-03 DIAGNOSIS — N941 Unspecified dyspareunia: Secondary | ICD-10-CM

## 2019-05-03 DIAGNOSIS — M6289 Other specified disorders of muscle: Secondary | ICD-10-CM

## 2019-05-12 ENCOUNTER — Ambulatory Visit: Payer: Managed Care, Other (non HMO) | Admitting: Physical Therapy

## 2019-11-07 ENCOUNTER — Telehealth: Payer: Self-pay | Admitting: Family Medicine

## 2019-11-07 NOTE — Telephone Encounter (Signed)
LVM asking patient to give the office a call back to be scheduled for an appointment, in order to have the paperwork filled out.

## 2019-11-07 NOTE — Telephone Encounter (Signed)
Patient dropped off a Health Assessment/Medical Report form, asked if she can be called once it is finished. Form is placed up front in Dr.Wolfe's folder.

## 2019-11-07 NOTE — Telephone Encounter (Signed)
Pt needs to be seen in the clinic. Please schedule an appointment.   Thank You

## 2019-11-22 ENCOUNTER — Encounter (HOSPITAL_COMMUNITY): Payer: Self-pay

## 2019-11-22 ENCOUNTER — Emergency Department (HOSPITAL_COMMUNITY)
Admission: EM | Admit: 2019-11-22 | Discharge: 2019-11-22 | Disposition: A | Discharge status: Home / Self Care | Payer: Managed Care, Other (non HMO) | Attending: Emergency Medicine | Admitting: Emergency Medicine

## 2019-11-22 ENCOUNTER — Emergency Department (HOSPITAL_COMMUNITY): Payer: Managed Care, Other (non HMO)

## 2019-11-22 ENCOUNTER — Other Ambulatory Visit: Payer: Self-pay

## 2019-11-22 ENCOUNTER — Ambulatory Visit (HOSPITAL_COMMUNITY)
Admission: EM | Admit: 2019-11-22 | Discharge: 2019-11-22 | Disposition: A | Discharge status: Home / Self Care | Payer: Managed Care, Other (non HMO) | Source: Home / Self Care

## 2019-11-22 DIAGNOSIS — R0789 Other chest pain: Secondary | ICD-10-CM | POA: Insufficient documentation

## 2019-11-22 DIAGNOSIS — R079 Chest pain, unspecified: Secondary | ICD-10-CM | POA: Diagnosis present

## 2019-11-22 LAB — I-STAT BETA HCG BLOOD, ED (MC, WL, AP ONLY): I-stat hCG, quantitative: <5 m[IU]/mL (ref <5)

## 2019-11-22 LAB — BASIC METABOLIC PANEL
Anion gap: 8 (ref 5–15)
BUN: 11 mg/dL (ref 6–20)
CO2: 28 mmol/L (ref 22–32)
Calcium: 9.4 mg/dL (ref 8.9–10.3)
Chloride: 101 mmol/L (ref 98–111)
Creatinine, Ser: 0.62 mg/dL (ref 0.44–1.00)
GFR calc Af Amer: >60 mL/min (ref >60)
GFR calc non Af Amer: >60 mL/min (ref >60)
Glucose, Bld: 103 mg/dL — ABNORMAL HIGH (ref 70–99)
Potassium: 4 mmol/L (ref 3.5–5.1)
Sodium: 137 mmol/L (ref 135–145)

## 2019-11-22 LAB — CBC
HCT: 36.1 % (ref 36.0–46.0)
Hemoglobin: 12.2 g/dL (ref 12.0–15.0)
MCH: 31.2 pg (ref 26.0–34.0)
MCHC: 33.8 g/dL (ref 30.0–36.0)
MCV: 92.3 fL (ref 80.0–100.0)
Platelets: 252 10*3/uL (ref 150–400)
RBC: 3.91 MIL/uL (ref 3.87–5.11)
RDW: 11.9 % (ref 11.5–15.5)
WBC: 6 10*3/uL (ref 4.0–10.5)
nRBC: 0 % (ref 0.0–0.2)

## 2019-11-22 LAB — TROPONIN I (HIGH SENSITIVITY): Troponin I (High Sensitivity): <2 ng/L (ref <18)

## 2019-11-22 MED ORDER — SODIUM CHLORIDE 0.9% FLUSH: 3.0000 mL | Freq: Once | INTRAVENOUS | Status: DC

## 2019-11-22 NOTE — ED Triage Notes (Signed)
Pt complains of chest pressure, blurry vision and nausea since 5pm, she was seen at Urgent Care and they sent her here for further evaluation

## 2019-11-22 NOTE — ED Triage Notes (Addendum)
Pt presents with complaints of a heaviness in her chest and blurry vision and headache that started at 5 pm. Pt reports chest pain is normal for her. Pt denies any other neurological symptoms. No neurological deficits present during intake.

## 2019-11-22 NOTE — Discharge Instructions (Addendum)
The cause of your symptoms was not identified today. Follow up with your family doctor for further evaluation.

## 2019-11-22 NOTE — ED Provider Notes (Signed)
[facility]-EMERGENCY DEPT Provider Note   CSN: [id number] Arrival date & time: 11/22/19  2036     History No chief complaint on file.   Michele Arias is a 34 y.o. female.  The history is provided by the patient and medical records. No language interpreter was used.   Michele Arias is a 34 y.o. female who presents to the Emergency Department complaining of chest pain. She presents the emergency department complaining of central chest pain described as a heaviness that started about 5 o'clock this afternoon when she got off of work. Symptoms lasted for a few hours. She had associated blurred vision, mild headache and nausea. Overall her symptoms are resolving. She denies any fevers, shortness of breath, abdominal pain, vomiting, leg swelling or pain. She has no medical problems and takes no medications. She does not smoke. She has no personal history of DVT or PE. No family history of coronary artery disease. She was seen in urgent care and referred to the emergency department for further evaluation.    Past Medical History:  Diagnosis Date  . Anxiety   . Depression   . Medical history non-contributory   . UTI (urinary tract infection)     There are no problems to display for this patient.   Past Surgical History:  Procedure Laterality Date  . NO PAST SURGERIES       OB History    Gravida  3   Para  2   Term  2   Preterm      AB  1   Living  2     SAB      TAB  1   Ectopic      Multiple  0   Live Births  2           Family History  Problem Relation Age of Onset  . Alcohol abuse Mother   . Depression Mother   . Drug abuse Mother   . Hypertension Mother   . Mental illness Mother   . Bipolar disorder Mother   . Alcohol abuse Father   . Drug abuse Sister   . Mental illness Sister   . Alcohol abuse Brother   . Depression Brother   . Mental illness Brother   . Cancer Maternal Grandmother   . Hypertension Maternal  Grandmother   . Stroke Maternal Grandmother   . Cancer Paternal Grandmother   . Miscarriages / Stillbirths Sister   . Alcohol abuse Brother   . Drug abuse Brother     Social History   Tobacco Use  . Smoking status: Never Smoker  . Smokeless tobacco: Never Used  Vaping Use  . Vaping Use: Never used  Substance Use Topics  . Alcohol use: Yes    Comment: rarely  . Drug use: No    Home Medications Prior to Admission medications   Medication Sig Start Date End Date Taking? Authorizing Provider  ASHWAGANDHA PO Take 300 mg by mouth at bedtime as needed (anxiety).    Yes [provider]  APPLE CIDER VINEGAR PO Take by mouth. Patient not taking: Reported on 11/22/2019    [provider]    Allergies    Patient has no known allergies.  Review of Systems   Review of Systems  All other systems reviewed and are negative.   Physical Exam Updated Vital Signs BP 105/76   Pulse 80   Temp 98 F (36.7 C) (Oral)   Resp 16   LMP  11/16/2019   SpO2 98%   Physical Exam Vitals and nursing note reviewed.  Constitutional:      Appearance: She is well-developed.  HENT:     Head: Normocephalic and atraumatic.  Cardiovascular:     Rate and Rhythm: Normal rate and regular rhythm.     Heart sounds: No murmur heard.   Pulmonary:     Effort: Pulmonary effort is normal. No respiratory distress.     Breath sounds: Normal breath sounds.  Abdominal:     Palpations: Abdomen is soft.     Tenderness: There is no abdominal tenderness. There is no guarding or rebound.  Musculoskeletal:        General: No swelling or tenderness.  Skin:    General: Skin is warm and dry.  Neurological:     Mental Status: She is alert and oriented to person, place, and time.  Psychiatric:        Behavior: Behavior normal.     ED Results / Procedures / Treatments   Labs (all labs ordered are listed, but only abnormal results are displayed) Labs Reviewed  BASIC METABOLIC PANEL - Abnormal;  Notable for the following components:      Result Value   Glucose, Bld 103 (*)    All other components within normal limits  CBC  I-STAT BETA HCG BLOOD, ED ([facility], [facility], [facility] ONLY)  TROPONIN I (HIGH SENSITIVITY)  TROPONIN I (HIGH SENSITIVITY)    EKG EKG Interpretation  Date/Time:  Wednesday November 22 2019 20:50:04 EDT Ventricular Rate:  82 PR Interval:    QRS Duration: 81 QT Interval:  366 QTC Calculation: 428 R Axis:   78 Text Interpretation: Sinus rhythm 12 Lead; Mason-Likar Confirmed by Tilden Fossa [phone]) on 11/22/2019 9:19:44 PM   Radiology DG Chest 2 View  Result Date: 11/22/2019 CLINICAL DATA:  Chest pain EXAM: CHEST - 2 VIEW COMPARISON:  09/06/2013 FINDINGS: The heart size and mediastinal contours are within normal limits. Both lungs are clear. The visualized skeletal structures are unremarkable. IMPRESSION: No active cardiopulmonary disease. Electronically Signed   By: Marlan Palau M.D.   On: 11/22/2019 21:28    Procedures Procedures (including critical care time)  Medications Ordered in ED Medications  sodium chloride flush (NS) 0.9 % injection 3 mL (3 mLs Intravenous Not Given 11/22/19 2203)    ED Course  I have reviewed the triage vital signs and the nursing notes.  Pertinent labs & imaging results that were available during my care of the patient were reviewed by me and considered in my medical decision making (see chart for details).    MDM Rules/Calculators/A&P                         Patient here for evaluation of chest pain, blurred vision and nausea that occurred earlier today. She is asymptomatic on ED evaluation. EKG without acute ischemic changes in troponin is negative. Doubt PE, she is PERC negative. Discussed with patient unclear source of symptoms but feels she is stable for discharge. Discussed outpatient follow-up and return precautions.  Doubt ACS, dissection, PE, pneumonia, CHF, cholecystitis.  Final Clinical Impression(s) / ED Diagnoses Final  diagnoses:  Atypical chest pain    Rx / DC Orders ED Discharge Orders    None       Tilden Fossa, MD 11/22/19 2319

## 2019-11-22 NOTE — ED Notes (Signed)
Patient is being discharged from the Urgent Care and sent to the Emergency Department via personal vehicle. Per Dr. Tracie Harrier, patient is in need of higher level of care due to blurred vision and chest pain. Patient is aware and verbalizes understanding of plan of care.  Vitals:   11/22/19 1926  BP: 110/66  Pulse: 74  Resp: 17  Temp: 98.3 F (36.8 C)  SpO2: 100%

## 2019-11-23 ENCOUNTER — Telehealth: Payer: Self-pay | Admitting: Family Medicine

## 2019-11-23 NOTE — Telephone Encounter (Signed)
Nurse Assessment Nurse: Pollyann Savoy, RN, Melissa Date/Time (Eastern Time): 11/22/2019 6:01:29 PM Confirm and document reason for call. If symptomatic, describe symptoms. ---Caller states she is experiencing blurry eyesight, headache(moderate), and chest pain. Chest pain started 2 days ago-intermittent. Blurry vision(constant) and headache started suddenly approx 30 min ago. Forehead pain-denies nasal congestion or drainage. Denies fever, cough or sob. Sharp chest pains-intermittent. Has the patient had close contact with a person known or suspected to have the novel coronavirus illness OR traveled / lives in area with major community spread (including international travel) in the last 14 days from the onset of symptoms? * If Asymptomatic, screen for exposure and travel within the last 14 days. ---No Does the patient have any new or worsening symptoms? ---Yes Will a triage be completed? ---Yes Related visit to physician within the last 2 weeks? ---No Does the PT have any chronic conditions? (i.e. diabetes, asthma, this includes High risk factors for pregnancy, etc.) ---No Is the patient pregnant or possibly pregnant? (Ask all females between the ages of 62-55) ---No Is this a behavioral health or substance abuse call? ---No PLEASE NOTE: All timestamps contained within this report are represented as [place] Standard Time. CONFIDENTIALTY NOTICE: This fax transmission is intended only for the addressee. It contains information that is legally privileged, confidential or otherwise protected from use or disclosure. If you are not the intended recipient, you are strictly prohibited from reviewing, disclosing, copying using or disseminating any of this information or taking any action in reliance on or regarding this information. If you have received this fax in error, please notify us immediately by telephone so that we can arrange for its return to [place]. Phone: [phone], Toll-Free: [phone],  Fax: [phone] Page: 2 of 2 Call Id: [id number] Guidelines Guideline Title Affirmed Question Affirmed Notes Nurse Date/Time Lamount Cohen Time) Chest Pain Patient sounds very sick or weak to the triager Pollyann Savoy, RN, [facility] 11/22/2019 6:04:23 PM Disp. Time Lamount Cohen Time) Disposition Final User 11/22/2019 5:59:55 PM Send to Urgent Queue Alyse Low 11/22/2019 6:08:57 PM Go to ED Now (or PCP triage) Yes Zayas, RN, Efraim Kaufmann Caller Disagree/Comply Comply Caller Understands Yes PreDisposition Call Doctor Care Advice Given Per Guideline GO TO ED NOW (OR PCP TRIAGE): * IF NO PCP (PRIMARY CARE PROVIDER) SECOND-LEVEL TRIAGE: You need to be seen within the next hour. Go to the ED/UCC at _____________ Hospital. Leave as soon as you can. CARE ADVICE given per Chest Pain (Adult) guideline. CALL EMS IF: * Severe difficulty breathing occurs * Passes out or becomes too weak to stand * You become worse. ANOTHER ADULT SHOULD DRIVE: * It is better and safer if another adult drives instead of you. Comments User: Susa Loffler, RN Date/Time Lamount Cohen Time): 11/22/2019 6:09:57 PM Blurred vision clears when blinks a couple of times-sharp chest pains few seconds-not currently, moderate headache-for these reasons, this nurse elevated disposition. Referrals [facility] [place] [facility]

## 2019-11-23 NOTE — Telephone Encounter (Signed)
FYI, patient seen at [facility] 11/22/19

## 2020-05-30 ENCOUNTER — Encounter: Payer: Managed Care, Other (non HMO) | Admitting: Family Medicine

## 2020-11-18 ENCOUNTER — Encounter: Payer: Managed Care, Other (non HMO) | Admitting: Physician Assistant

## 2020-11-18 ENCOUNTER — Other Ambulatory Visit: Payer: Self-pay

## 2020-11-18 ENCOUNTER — Ambulatory Visit (INDEPENDENT_AMBULATORY_CARE_PROVIDER_SITE_OTHER): Payer: No Typology Code available for payment source | Admitting: Physician Assistant

## 2020-11-18 ENCOUNTER — Encounter: Payer: Self-pay | Admitting: Physician Assistant

## 2020-11-18 VITALS — BP 107/74 | HR 74 | Temp 98.5°F | Ht 66.0 in | Wt 148.0 lb

## 2020-11-18 DIAGNOSIS — Z0000 Encounter for general adult medical examination without abnormal findings: Secondary | ICD-10-CM

## 2020-11-18 DIAGNOSIS — Z131 Encounter for screening for diabetes mellitus: Secondary | ICD-10-CM | POA: Diagnosis not present

## 2020-11-18 DIAGNOSIS — R5383 Other fatigue: Secondary | ICD-10-CM | POA: Diagnosis not present

## 2020-11-18 DIAGNOSIS — F5101 Primary insomnia: Secondary | ICD-10-CM

## 2020-11-18 DIAGNOSIS — Z13220 Encounter for screening for lipoid disorders: Secondary | ICD-10-CM

## 2020-11-18 DIAGNOSIS — F418 Other specified anxiety disorders: Secondary | ICD-10-CM

## 2020-11-18 LAB — CBC WITH DIFFERENTIAL/PLATELET
Basophils Absolute: 0 10*3/uL (ref 0.0–0.1)
Basophils Relative: 0.5 % (ref 0.0–3.0)
Eosinophils Absolute: 0.2 10*3/uL (ref 0.0–0.7)
Eosinophils Relative: 3.9 % (ref 0.0–5.0)
HCT: 34.2 % — ABNORMAL LOW (ref 36.0–46.0)
Hemoglobin: 11.7 g/dL — ABNORMAL LOW (ref 12.0–15.0)
Lymphocytes Relative: 31.4 % (ref 12.0–46.0)
Lymphs Abs: 1.4 10*3/uL (ref 0.7–4.0)
MCHC: 34.3 g/dL (ref 30.0–36.0)
MCV: 90.5 fl (ref 78.0–100.0)
Monocytes Absolute: 0.3 10*3/uL (ref 0.1–1.0)
Monocytes Relative: 6.3 % (ref 3.0–12.0)
Neutro Abs: 2.7 10*3/uL (ref 1.4–7.7)
Neutrophils Relative %: 57.9 % (ref 43.0–77.0)
Platelets: 268 10*3/uL (ref 150.0–400.0)
RBC: 3.78 Mil/uL — ABNORMAL LOW (ref 3.87–5.11)
RDW: 12.8 % (ref 11.5–15.5)
WBC: 4.6 10*3/uL (ref 4.0–10.5)

## 2020-11-18 LAB — COMPREHENSIVE METABOLIC PANEL
ALT: 11 U/L (ref 0–35)
AST: 12 U/L (ref 0–37)
Albumin: 3.8 g/dL (ref 3.5–5.2)
Alkaline Phosphatase: 42 U/L (ref 39–117)
BUN: 10 mg/dL (ref 6–23)
CO2: 27 mEq/L (ref 19–32)
Calcium: 8.6 mg/dL (ref 8.4–10.5)
Chloride: 107 mEq/L (ref 96–112)
Creatinine, Ser: 0.69 mg/dL (ref 0.40–1.20)
GFR: 112.77 mL/min (ref >60.00)
Glucose, Bld: 92 mg/dL (ref 70–99)
Potassium: 3.7 mEq/L (ref 3.5–5.1)
Sodium: 141 mEq/L (ref 135–145)
Total Bilirubin: 0.7 mg/dL (ref 0.2–1.2)
Total Protein: 6.2 g/dL (ref 6.0–8.3)

## 2020-11-18 LAB — LIPID PANEL
Cholesterol: 150 mg/dL (ref 0–200)
HDL: 40.9 mg/dL (ref >39.00)
LDL Cholesterol: 90 mg/dL (ref 0–99)
NonHDL: 108.91
Total CHOL/HDL Ratio: 4
Triglycerides: 97 mg/dL (ref 0.0–149.0)
VLDL: 19.4 mg/dL (ref 0.0–40.0)

## 2020-11-18 LAB — VITAMIN D 25 HYDROXY (VIT D DEFICIENCY, FRACTURES): VITD: 18.47 ng/mL — ABNORMAL LOW (ref 30.00–100.00)

## 2020-11-18 LAB — VITAMIN B12: Vitamin B-12: 338 pg/mL (ref 211–911)

## 2020-11-18 LAB — TSH: TSH: 0.97 u[IU]/mL (ref 0.35–5.50)

## 2020-11-18 NOTE — Patient Instructions (Signed)
Good to see you today! Please go to the lab for blood work and I will send results through MyChart. Referral placed for counseling, they should call you. Try to start walking daily - 10 minutes for one week, and then slowly add 2-5 minutes each week. If you are experiencing any shortness of breath or chest pain with exertion, please let me know and we will need to refer to cardiology.

## 2020-11-18 NOTE — Progress Notes (Signed)
Established Patient Office Visit  Subjective:  Patient ID: Michele Arias, female    DOB: [birth year]  Age: 35 y.o. MRN: [id number]  CC:  Chief Complaint  Patient presents with   Annual Exam    HPI Michele Arias presents for annual exam and TOC from Dr. Artis Flock.  Acute concerns: Anxiety worsening, starting to have more panic feelings  Health maintenance: Lifestyle/ exercise: Does not exercise Nutrition: Eats healthy food but usually skips breakfast. Mental health: Feels anxiety around crowds Caffeine: 1-2 Coca col[employer] daily Sleep: Good Substance use: No Sexual activity: Yes, married Immunizations: UTD Pap: UTD   Past Medical History:  Diagnosis Date   Anxiety    Depression    Medical history non-contributory    UTI (urinary tract infection)     Past Surgical History:  Procedure Laterality Date   NO PAST SURGERIES      Family History  Problem Relation Age of Onset   Alcohol abuse Mother    Depression Mother    Drug abuse Mother    Hypertension Mother    Mental illness Mother    Bipolar disorder Mother    Alcohol abuse Father    Drug abuse Sister    Mental illness Sister    Alcohol abuse Brother    Depression Brother    Mental illness Brother    Cancer Maternal Grandmother    Hypertension Maternal Grandmother    Stroke Maternal Grandmother    Cancer Paternal Grandmother    Miscarriages / Stillbirths Sister    Alcohol abuse Brother    Drug abuse Brother     Social History   Socioeconomic History   Marital status: Married    Spouse name: Not on file   Number of children: Not on file   Years of education: Not on file   Highest education level: Not on file  Occupational History   Not on file  Tobacco Use   Smoking status: Never   Smokeless tobacco: Never  Vaping Use   Vaping Use: Never used  Substance and Sexual Activity   Alcohol use: Yes    Comment: rarely   Drug use: No   Sexual activity: Not Currently    Partners: Male     Birth control/protection: Surgical    Comment: husband-vasectomy  Other Topics Concern   Not on file  Social History Narrative   Not on file   Social Determinants of Health   Financial Resource Strain: Not on file  Food Insecurity: Not on file  Transportation Needs: Not on file  Physical Activity: Not on file  Stress: Not on file  Social Connections: Not on file  Intimate Partner Violence: Not on file    Outpatient Medications Prior to Visit  Medication Sig Dispense Refill   ASHWAGANDHA PO Take 300 mg by mouth at bedtime as needed (anxiety).      APPLE CIDER VINEGAR PO Take by mouth. (Patient not taking: Reported on 11/22/2019)     No facility-administered medications prior to visit.    No Known Allergies  ROS Review of Systems  Constitutional:  Positive for fatigue (None currently). Negative for chills, diaphoresis and fever.  HENT:  Negative for congestion and sore throat.   Respiratory:  Positive for shortness of breath (None currently).   Cardiovascular:  Negative for chest pain.  Gastrointestinal:  Negative for constipation and diarrhea.  Musculoskeletal:  Negative for back pain and neck pain.  Neurological:  Negative for dizziness, light-headedness, numbness and headaches.  Anxiety and night sweats     Objective:    Physical Exam Constitutional:      Appearance: Normal appearance.  HENT:     Head: Normocephalic and atraumatic.     Right Ear: Tympanic membrane and external ear normal.     Left Ear: Tympanic membrane and external ear normal.     Nose: Nose normal.     Mouth/Throat:     Mouth: Mucous membranes are moist.  Eyes:     Extraocular Movements: Extraocular movements intact.     Pupils: Pupils are equal, round, and reactive to light.  Cardiovascular:     Rate and Rhythm: Normal rate and regular rhythm.     Pulses: Normal pulses.     Heart sounds: Normal heart sounds.  Pulmonary:     Effort: Pulmonary effort is normal.     Breath sounds:  Normal breath sounds.  Genitourinary:    General: Normal vulva.     Rectum: Normal.  Musculoskeletal:     Cervical back: Normal range of motion and neck supple.  Skin:    General: Skin is dry.  Neurological:     General: No focal deficit present.     Mental Status: She is alert and oriented to person, place, and time.    BP 107/74   Pulse 74   Temp 98.5 F (36.9 C)   Ht 5\' 6"  (1.676 m)   Wt 148 lb (67.1 kg)   LMP 11/17/2020   SpO2 99%   BMI 23.89 kg/m  Wt Readings from Last 3 Encounters:  11/18/20 148 lb (67.1 kg)  04/25/19 153 lb 6.4 oz (69.6 kg)  03/13/19 145 lb 6.4 oz (66 kg)     Health Maintenance Due  Topic Date Due   Hepatitis C Screening  Never done    There are no preventive care reminders to display for this patient.  Lab Results  Component Value Date   TSH 1.49 12/19/2018   Lab Results  Component Value Date   WBC 6.0 11/22/2019   HGB 12.2 11/22/2019   HCT 36.1 11/22/2019   MCV 92.3 11/22/2019   PLT 252 11/22/2019   Lab Results  Component Value Date   NA 137 11/22/2019   K 4.0 11/22/2019   CO2 28 11/22/2019   GLUCOSE 103 (H) 11/22/2019   BUN 11 11/22/2019   CREATININE 0.62 11/22/2019   BILITOT 0.5 12/19/2018   ALKPHOS 40 12/19/2018   AST 15 12/19/2018   ALT 17 12/19/2018   PROT 7.0 12/19/2018   ALBUMIN 4.3 12/19/2018   CALCIUM 9.4 11/22/2019   ANIONGAP 8 11/22/2019   GFR 109.31 12/19/2018   No results found for: CHOL No results found for: HDL No results found for: LDLCALC No results found for: TRIG No results found for: CHOLHDL No results found for: 12/21/2018    Assessment & Plan:   Problem List Items Addressed This Visit   None   No orders of the defined types were placed in this encounter.   Follow-up: No follow-ups on file.   1. Encounter for annual physical exam 2. Other fatigue 3. Diabetes mellitus screening 4. Screening for cholesterol level Age-appropriate screening and counseling performed today. Will check labs  and call with results. Preventive measures discussed and printed in AVS for patient.   5. Primary insomnia Doing well with Ashwagandha 300 mg at bedtime.  6. Situational anxiety Mostly related to her kids or in crowds. She agrees that counseling would be a good next step for  her. She does not want to take any medication for this as of now.    I,Savera Zaman,acting as a Neurosurgeon for [employer], PA-C.,have documented all relevant documentation on the behalf of Wilmarie Sparlin M Markeita Alicia, PA-C,as directed by  [employer], PA-C while in the presence of Thelton Graca M Deven Audi, PA-C.  I, Zayla Agar M Saul Fabiano, PA-C, have reviewed all documentation for this visit. The documentation on 11/20/20 for the exam, diagnosis, procedures, and orders are all accurate and complete.  Talbert Nan

## 2020-11-18 NOTE — Progress Notes (Deleted)
Established Patient Office Visit  Subjective:  Patient ID: Michele Arias, female    DOB: Sep 13, 1985  Age: 35 y.o. MRN: [id number]  CC:  Chief Complaint  Patient presents with   Annual Exam    HPI Michele Arias presents for annual exam.  Acute concerns: ***  Health maintenance: Lifestyle/ exercise: *** Nutrition: *** Mental health: *** Caffeine: *** Sleep: *** Substance use: *** Sexual activity: *** Immunizations: *** Pap: ***   Past Medical History:  Diagnosis Date   Anxiety    Depression    Medical history non-contributory    UTI (urinary tract infection)     Past Surgical History:  Procedure Laterality Date   NO PAST SURGERIES      Family History  Problem Relation Age of Onset   Alcohol abuse Mother    Depression Mother    Drug abuse Mother    Hypertension Mother    Mental illness Mother    Bipolar disorder Mother    Alcohol abuse Father    Drug abuse Sister    Mental illness Sister    Alcohol abuse Brother    Depression Brother    Mental illness Brother    Cancer Maternal Grandmother    Hypertension Maternal Grandmother    Stroke Maternal Grandmother    Cancer Paternal Grandmother    Miscarriages / Stillbirths Sister    Alcohol abuse Brother    Drug abuse Brother     Social History   Socioeconomic History   Marital status: Married    Spouse name: Not on file   Number of children: Not on file   Years of education: Not on file   Highest education level: Not on file  Occupational History   Not on file  Tobacco Use   Smoking status: Never   Smokeless tobacco: Never  Vaping Use   Vaping Use: Never used  Substance and Sexual Activity   Alcohol use: Yes    Comment: rarely   Drug use: No   Sexual activity: Not Currently    Partners: Male    Birth control/protection: Surgical    Comment: husband-vasectomy  Other Topics Concern   Not on file  Social History Narrative   Not on file   Social Determinants of Health    Financial Resource Strain: Not on file  Food Insecurity: Not on file  Transportation Needs: Not on file  Physical Activity: Not on file  Stress: Not on file  Social Connections: Not on file  Intimate Partner Violence: Not on file    Outpatient Medications Prior to Visit  Medication Sig Dispense Refill   ASHWAGANDHA PO Take 300 mg by mouth at bedtime as needed (anxiety).      APPLE CIDER VINEGAR PO Take by mouth. (Patient not taking: Reported on 11/22/2019)     No facility-administered medications prior to visit.    No Known Allergies  ROS Review of Systems    Objective:    Physical Exam  BP 107/74   Pulse 74   Temp 98.5 F (36.9 C)   Ht 5\' 6"  (1.676 m)   Wt 148 lb (67.1 kg)   LMP 11/17/2020   SpO2 99%   BMI 23.89 kg/m  Wt Readings from Last 3 Encounters:  11/18/20 148 lb (67.1 kg)  04/25/19 153 lb 6.4 oz (69.6 kg)  03/13/19 145 lb 6.4 oz (66 kg)     Health Maintenance Due  Topic Date Due   Hepatitis C Screening  Never done    There  are no preventive care reminders to display for this patient.  Lab Results  Component Value Date   TSH 1.49 12/19/2018   Lab Results  Component Value Date   WBC 6.0 11/22/2019   HGB 12.2 11/22/2019   HCT 36.1 11/22/2019   MCV 92.3 11/22/2019   PLT 252 11/22/2019   Lab Results  Component Value Date   NA 137 11/22/2019   K 4.0 11/22/2019   CO2 28 11/22/2019   GLUCOSE 103 (H) 11/22/2019   BUN 11 11/22/2019   CREATININE 0.62 11/22/2019   BILITOT 0.5 12/19/2018   ALKPHOS 40 12/19/2018   AST 15 12/19/2018   ALT 17 12/19/2018   PROT 7.0 12/19/2018   ALBUMIN 4.3 12/19/2018   CALCIUM 9.4 11/22/2019   ANIONGAP 8 11/22/2019   GFR 109.31 12/19/2018   No results found for: CHOL No results found for: HDL No results found for: LDLCALC No results found for: TRIG No results found for: CHOLHDL No results found for: ZOXW9U    Assessment & Plan:   Problem List Items Addressed This Visit   None   No orders of the  defined types were placed in this encounter.   Follow-up: No follow-ups on file.    Anyiah Coverdale M Meron Bocchino, PA-C

## 2020-11-19 ENCOUNTER — Telehealth: Payer: Self-pay

## 2020-11-19 NOTE — Telephone Encounter (Signed)
Noted  

## 2020-11-19 NOTE — Telephone Encounter (Signed)
Patient would like a call back to discuss lab work around 8:30am tomorrow morning before she goes to work.

## 2020-11-20 ENCOUNTER — Encounter: Payer: Self-pay | Admitting: Physician Assistant

## 2020-11-20 ENCOUNTER — Other Ambulatory Visit: Payer: Self-pay

## 2020-11-20 MED ORDER — CHOLECALCIFEROL 1.25 MG (50000 UT) PO TABS
ORAL_TABLET | ORAL | 0 refills | Status: DC
Start: 2020-11-20 — End: 2021-07-23

## 2021-02-25 DIAGNOSIS — F419 Anxiety disorder, unspecified: Secondary | ICD-10-CM | POA: Insufficient documentation

## 2021-03-21 ENCOUNTER — Encounter (HOSPITAL_BASED_OUTPATIENT_CLINIC_OR_DEPARTMENT_OTHER): Payer: Self-pay | Admitting: *Deleted

## 2021-03-21 ENCOUNTER — Other Ambulatory Visit: Payer: Self-pay

## 2021-03-21 ENCOUNTER — Emergency Department (HOSPITAL_BASED_OUTPATIENT_CLINIC_OR_DEPARTMENT_OTHER)
Admission: EM | Admit: 2021-03-21 | Discharge: 2021-03-21 | Disposition: A | Discharge status: Home / Self Care | Payer: No Typology Code available for payment source | Attending: Emergency Medicine | Admitting: Emergency Medicine

## 2021-03-21 DIAGNOSIS — R0602 Shortness of breath: Secondary | ICD-10-CM | POA: Insufficient documentation

## 2021-03-21 DIAGNOSIS — Z0000 Encounter for general adult medical examination without abnormal findings: Secondary | ICD-10-CM | POA: Diagnosis not present

## 2021-03-21 DIAGNOSIS — F419 Anxiety disorder, unspecified: Secondary | ICD-10-CM | POA: Insufficient documentation

## 2021-03-21 LAB — URINALYSIS, ROUTINE W REFLEX MICROSCOPIC
Bilirubin Urine: NEGATIVE
Glucose, UA: NEGATIVE mg/dL
Hgb urine dipstick: NEGATIVE
Nitrite: NEGATIVE
Specific Gravity, Urine: 1.024 (ref 1.005–1.030)
pH: 6.5 (ref 5.0–8.0)

## 2021-03-21 LAB — PREGNANCY, URINE: Preg Test, Ur: NEGATIVE

## 2021-03-21 MED ORDER — IPRATROPIUM-ALBUTEROL 0.5-2.5 (3) MG/3ML IN SOLN
RESPIRATORY_TRACT | Status: AC
  Administered 2021-03-21: 3 mL via RESPIRATORY_TRACT
  Filled 2021-03-21: qty 3

## 2021-03-21 MED ORDER — IPRATROPIUM-ALBUTEROL 0.5-2.5 (3) MG/3ML IN SOLN: 3.0000 mL | Freq: Once | RESPIRATORY_TRACT | Status: AC

## 2021-03-21 NOTE — Discharge Instructions (Signed)
You have been seen and discharged from the emergency department.  Follow-up with your primary provider for reevaluation and further care. Take home medications as prescribed. If you have any worsening symptoms or further concerns for your health please return to an emergency department for further evaluation. 

## 2021-03-21 NOTE — ED Notes (Signed)
Pt reassessed, v/s updated. Pt denies pain at this time. Endorses anxiety. Pt vomited in triage room, sig other with patient. Pt was provided with an emesis bag.

## 2021-03-21 NOTE — ED Provider Notes (Signed)
[facility] EMERGENCY DEPT Provider Note   CSN: [id number] Arrival date & time: 03/21/21  1949     History Chief Complaint  Patient presents with   Shortness of Breath    Michele Arias is a 35 y.o. female.  HPI  35 year old female with past medical history of anxiety depression presents emergency department after an episode of anxiety and shortness of breath.  Patient states that she took an edible just prior during going to dinner with her significant other.  While at dinner she started to feel anxious like the room was spinning.  She tried to go outside to get some air but the symptoms did not subside.  They came here for evaluation.  She felt very anxious in the department and had an episode of vomiting in the bathroom.  Patient admits to feeling anxious.  She typically takes homeopathic vitamins for her anxiety but was feeling extra anxious today so she tried an edible.  She has never taken edibles before.  On my evaluation the patient feels back to baseline.  Denies any chest pain, shortness of breath.  She still feels anxious but states this is more around her functional level.  Patient is scheduled to initiate care with therapist.  Does not take any medication for anxiety.  Past Medical History:  Diagnosis Date   Anxiety    Depression    Medical history non-contributory    UTI (urinary tract infection)     There are no problems to display for this patient.   Past Surgical History:  Procedure Laterality Date   NO PAST SURGERIES       OB History     Gravida  3   Para  2   Term  2   Preterm      AB  1   Living  2      SAB      IAB  1   Ectopic      Multiple  0   Live Births  2           Family History  Problem Relation Age of Onset   Alcohol abuse Mother    Depression Mother    Drug abuse Mother    Hypertension Mother    Mental illness Mother    Bipolar disorder Mother    Alcohol abuse Father    Drug abuse Sister     Mental illness Sister    Alcohol abuse Brother    Depression Brother    Mental illness Brother    Cancer Maternal Grandmother    Hypertension Maternal Grandmother    Stroke Maternal Grandmother    Cancer Paternal Grandmother    Miscarriages / Stillbirths Sister    Alcohol abuse Brother    Drug abuse Brother     Social History   Tobacco Use   Smoking status: Never   Smokeless tobacco: Never  Vaping Use   Vaping Use: Never used  Substance Use Topics   Alcohol use: Yes    Comment: rarely   Drug use: No    Home Medications Prior to Admission medications   Medication Sig Start Date End Date Taking? Authorizing Provider  ASHWAGANDHA PO Take 300 mg by mouth at bedtime as needed (anxiety).    Yes [provider]  Cholecalciferol 1.25 MG (50000 UT) TABS 50,000 units PO qwk fo12 weeks. 11/20/20   Allwardt, Crist Infante, PA-C    Allergies    Patient has no known allergies.  Review of Systems  Review of Systems  Constitutional:  Negative for chills and fever.  HENT:  Negative for congestion.   Eyes:  Negative for visual disturbance.  Respiratory:  Negative for shortness of breath.   Cardiovascular:  Negative for chest pain.  Gastrointestinal:  Negative for abdominal pain, diarrhea and vomiting.  Genitourinary:  Negative for dysuria.  Skin:  Negative for rash.  Neurological:  Negative for headaches.  Psychiatric/Behavioral:  Positive for decreased concentration. Negative for suicidal ideas. The patient is nervous/anxious and is hyperactive.    Physical Exam Updated Vital Signs BP 101/69   Pulse 81   Temp 97.6 F (36.4 C)   Resp 16   Ht 5\' 7"  (1.702 m)   Wt 65.8 kg   LMP 03/07/2021   SpO2 100%   BMI 22.71 kg/m   Physical Exam Vitals and nursing note reviewed.  Constitutional:      General: She is not in acute distress.    Appearance: Normal appearance. She is not diaphoretic.  HENT:     Head: Normocephalic.     Mouth/Throat:     Mouth: Mucous membranes  are moist.  Cardiovascular:     Rate and Rhythm: Normal rate.  Pulmonary:     Effort: Pulmonary effort is normal. No respiratory distress.  Abdominal:     Palpations: Abdomen is soft.     Tenderness: There is no abdominal tenderness.  Skin:    General: Skin is warm.  Neurological:     Mental Status: She is alert and oriented to person, place, and time. Mental status is at baseline.  Psychiatric:        Mood and Affect: Mood is anxious.    ED Results / Procedures / Treatments   Labs (all labs ordered are listed, but only abnormal results are displayed) Labs Reviewed  URINALYSIS, ROUTINE W REFLEX MICROSCOPIC - Abnormal; Notable for the following components:      Result Value   Ketones, ur TRACE (*)    Protein, ur TRACE (*)    Leukocytes,Ua SMALL (*)    All other components within normal limits  PREGNANCY, URINE    EKG EKG Interpretation  Date/Time:  Friday March 21 2021 21:32:56 EST Ventricular Rate:  96 PR Interval:  130 QRS Duration: 79 QT Interval:  360 QTC Calculation: 455 R Axis:   71 Text Interpretation: Sinus rhythm NSR Confirmed by [id number] ([id number]) on 03/21/2021 9:58:18 PM  Radiology No results found.  Procedures Procedures   Medications Ordered in ED Medications  ipratropium-albuterol (DUONEB) 0.5-2.5 (3) MG/3ML nebulizer solution 3 mL (3 mLs Nebulization Given 03/21/21 1957)    ED Course  I have reviewed the triage vital signs and the nursing notes.  Pertinent labs & imaging results that were available during my care of the patient were reviewed by me and considered in my medical decision making (see chart for details).    MDM Rules/Calculators/A&P                           35 year old female presents emergency department after episode of anxiety, possible panic attack.  On my evaluation she is back to baseline with no active complaints.  She did take an edible earlier which is unusual for her, unclear if this could have contributed to  her symptoms that she had prior.  On my evaluation vitals are normal.  EKG shows no acute ischemic changes.  Pregnancy test is negative, urinalysis is unremarkable.  Patient has been able  to eat and drink with no return of symptoms.  Is already scheduled to initiate care with a therapist.  Patient at this time appears safe and stable for discharge and will be treated as an outpatient.  Discharge plan and strict return to ED precautions discussed, patient verbalizes understanding and agreement.  Final Clinical Impression(s) / ED Diagnoses Final diagnoses:  General medical exam    Rx / DC Orders ED Discharge Orders     None        Rozelle Logan, DO 03/21/21 [id number]

## 2021-03-21 NOTE — ED Notes (Signed)
Pt verbalizes understanding of discharge instructions. Opportunity for questioning and answers were provided. Pt discharged from ED to home.   ? ?

## 2021-03-21 NOTE — ED Notes (Signed)
Pt in triage receiving breathing treatment. Upon completion of treatment pt respiratory status better but pt states she is having a panic attack. Pt became tachypneic and RT attempted to have pt slow her breathing down by performing deep breathing exercised. Pt became nauseated and began to vomit. Triage RN notified of her current situation. RT will continue to monitor.

## 2021-03-21 NOTE — ED Triage Notes (Signed)
Pt reports hx of anxiety. Took CBD gummy around 1300 today, onset of SOB around 1800 tonight.

## 2021-03-23 ENCOUNTER — Encounter: Payer: Self-pay | Admitting: Physician Assistant

## 2021-06-19 ENCOUNTER — Other Ambulatory Visit: Payer: Self-pay

## 2021-06-19 ENCOUNTER — Emergency Department (HOSPITAL_BASED_OUTPATIENT_CLINIC_OR_DEPARTMENT_OTHER)
Admission: EM | Admit: 2021-06-19 | Discharge: 2021-06-19 | Disposition: A | Discharge status: Home / Self Care | Payer: No Typology Code available for payment source | Attending: Emergency Medicine | Admitting: Emergency Medicine

## 2021-06-19 ENCOUNTER — Other Ambulatory Visit (HOSPITAL_BASED_OUTPATIENT_CLINIC_OR_DEPARTMENT_OTHER): Payer: Self-pay

## 2021-06-19 ENCOUNTER — Encounter (HOSPITAL_BASED_OUTPATIENT_CLINIC_OR_DEPARTMENT_OTHER): Payer: Self-pay

## 2021-06-19 DIAGNOSIS — F410 Panic disorder [episodic paroxysmal anxiety] without agoraphobia: Secondary | ICD-10-CM | POA: Insufficient documentation

## 2021-06-19 MED ORDER — HYDROXYZINE HCL 25 MG PO TABS
25.0000 mg | ORAL_TABLET | Freq: Four times a day (QID) | ORAL | 0 refills | Status: DC | PRN
  Filled 2021-06-19: qty 12, 3d supply, fill #0

## 2021-06-19 NOTE — ED Provider Notes (Signed)
[facility]CENTER [facility] EMERGENCY DEPT Provider Note   CSN: [id number] Arrival date & time: 06/19/21  1233     History  No chief complaint on file.   Michele Arias is a 36 y.o. female.  The history is provided by the patient and medical records. No language interpreter was used.   36 year old female significant history of anxiety and depression presenting to the ED with concerns of a panic attack.  Patient reports earlier in the day while she was in the class, she felt her heart racing and having some pain in his chest.  She mentioned it to her colleagues and they recommend patient to to go outside to take some fresh air.  She did walk outside and felt her symptoms become more intense in which she collapsed onto the ground and also experiencing tingling sensation to her hands and toes.  Incident happened for approximately 20 minutes but did improve, she rested for a bit and then she experiencing the same symptoms again but this time lasting for about 10 minutes.  At this time her symptoms completely resolved.  She recall having one similar episode in November of last year.  She denies any inciting factor.  She does not complain of any exertional chest pain shortness of breath productive cough neck pain abdominal pain nausea vomiting.  She uses alcohol on occasion but denies tobacco use and denies any significant cardiac history or any family history of premature cardiac death.  She states that her stress level has roughly about the same.  No recent medication changes, no other environmental changes.  No prior history of PE or DVT.  Home Medications Prior to Admission medications   Medication Sig Start Date End Date Taking? Authorizing Provider  ASHWAGANDHA PO Take 300 mg by mouth at bedtime as needed (anxiety).    Yes [provider]  Cholecalciferol 1.25 MG (50000 UT) TABS 50,000 units PO qwk fo12 weeks. Patient not taking: Reported on 06/19/2021 11/20/20   Allwardt, Crist Infante,  PA-C      Allergies    Patient has no known allergies.    Review of Systems   Review of Systems  All other systems reviewed and are negative.  Physical Exam Updated Vital Signs BP 119/79    Pulse 82    Temp 97.8 F (36.6 C)    Resp 16    Ht 5\' 7"  (1.702 m)    Wt 63.5 kg    SpO2 100%    BMI 21.93 kg/m  Physical Exam Vitals and nursing note reviewed.  Constitutional:      General: She is not in acute distress.    Appearance: She is well-developed.  HENT:     Head: Atraumatic.  Eyes:     Conjunctiva/sclera: Conjunctivae normal.  Cardiovascular:     Rate and Rhythm: Normal rate and regular rhythm.     Pulses: Normal pulses.     Heart sounds: Normal heart sounds.  Pulmonary:     Effort: Pulmonary effort is normal.     Breath sounds: No wheezing, rhonchi or rales.  Abdominal:     Palpations: Abdomen is soft.     Tenderness: There is no abdominal tenderness.  Musculoskeletal:     Cervical back: Normal range of motion and neck supple. No tenderness.  Skin:    Findings: No rash.  Neurological:     Mental Status: She is alert.  Psychiatric:        Mood and Affect: Mood normal.    ED Results /  Procedures / Treatments   Labs (all labs ordered are listed, but only abnormal results are displayed) Labs Reviewed - No data to display  EKG None  Date: 06/19/2021  Rate: 75  Rhythm: normal sinus rhythm  QRS Axis: normal  Intervals: normal  ST/T Wave abnormalities: normal  Conduction Disutrbances: none  Narrative Interpretation:   Old EKG Reviewed: No significant changes noted    Radiology No results found.  Procedures Procedures    Medications Ordered in ED Medications - No data to display  ED Course/ Medical Decision Making/ A&P                           Medical Decision Making Problems Addressed: Panic attack: acute illness or injury    Details: -has resolved -will provide vistaril PRN for anxiety  Amount and/or Complexity of Data Reviewed External  Data Reviewed: notes. ECG/medicine tests: ordered and independent interpretation performed. Decision-making details documented in ED Course.  Risk Prescription drug management.   BP 119/79    Pulse 82    Temp 97.8 F (36.6 C)    Resp 16    Ht 5\' 7"  (1.702 m)    Wt 63.5 kg    SpO2 100%    BMI 21.93 kg/m   2:32 PM This is a 36 year old female with significant history of anxiety and depression who is here with presentation suggestive of a panic attack.  At this time she is doing well.  Symptom is atypical of ACS.  Low suspicion for PE based on PERC criteria.  EKG obtained and independently review by me without any concerning arrhythmia or ischemic changes.  Vital signs stable.  This symptom not consistent with thyroid storm or myxedema coma.  Patient has been monitored in the ED for the past 2 hours.  On reassessment patient states she is still feeling normal at her baseline.  At this time I felt patient stable for discharge.  Will prescribe Vistaril to use as needed for anxiety.  Return precaution given.  Outpatient follow-up recommended.  Patient voiced understanding and agrees with plan.        Final Clinical Impression(s) / ED Diagnoses Final diagnoses:  Panic attack    Rx / DC Orders ED Discharge Orders          Ordered    hydrOXYzine (ATARAX) 25 MG tablet  Every 6 hours PRN        06/19/21 1435              06/21/21, PA-C 06/19/21 1437    06/21/21, MD 06/23/21 1012

## 2021-06-19 NOTE — ED Triage Notes (Signed)
Pt came in POV c/o of post panic attack. Pt was at work and felt a little bit of CP that lead to [facility] and Tachypnea and she collapsed to the ground but no LOC. Pt did feel tingling in her hand and feet as well during this event. Pt states she has had this happen before.

## 2021-07-10 ENCOUNTER — Encounter: Payer: Self-pay | Admitting: Neurology

## 2021-07-10 ENCOUNTER — Encounter: Payer: Self-pay | Admitting: Physician Assistant

## 2021-07-10 ENCOUNTER — Ambulatory Visit (INDEPENDENT_AMBULATORY_CARE_PROVIDER_SITE_OTHER): Payer: No Typology Code available for payment source | Admitting: Physician Assistant

## 2021-07-10 VITALS — BP 118/82 | HR 78 | Temp 98.2°F | Wt 141.5 lb

## 2021-07-10 DIAGNOSIS — G43009 Migraine without aura, not intractable, without status migrainosus: Secondary | ICD-10-CM

## 2021-07-10 DIAGNOSIS — R002 Palpitations: Secondary | ICD-10-CM

## 2021-07-10 DIAGNOSIS — F410 Panic disorder [episodic paroxysmal anxiety] without agoraphobia: Secondary | ICD-10-CM

## 2021-07-10 DIAGNOSIS — D508 Other iron deficiency anemias: Secondary | ICD-10-CM | POA: Diagnosis not present

## 2021-07-10 LAB — COMPREHENSIVE METABOLIC PANEL
ALT: 10 U/L (ref 0–35)
AST: 11 U/L (ref 0–37)
Albumin: 4.3 g/dL (ref 3.5–5.2)
Alkaline Phosphatase: 42 U/L (ref 39–117)
BUN: 14 mg/dL (ref 6–23)
CO2: 27 mEq/L (ref 19–32)
Calcium: 9.3 mg/dL (ref 8.4–10.5)
Chloride: 105 mEq/L (ref 96–112)
Creatinine, Ser: 0.77 mg/dL (ref 0.40–1.20)
GFR: 99.78 mL/min (ref >60.00)
Glucose, Bld: 89 mg/dL (ref 70–99)
Potassium: 3.9 mEq/L (ref 3.5–5.1)
Sodium: 139 mEq/L (ref 135–145)
Total Bilirubin: 0.5 mg/dL (ref 0.2–1.2)
Total Protein: 7.1 g/dL (ref 6.0–8.3)

## 2021-07-10 LAB — CBC WITH DIFFERENTIAL/PLATELET
Basophils Absolute: 0 10*3/uL (ref 0.0–0.1)
Basophils Relative: 0.9 % (ref 0.0–3.0)
Eosinophils Absolute: 0.1 10*3/uL (ref 0.0–0.7)
Eosinophils Relative: 2.9 % (ref 0.0–5.0)
HCT: 36.8 % (ref 36.0–46.0)
Hemoglobin: 12.7 g/dL (ref 12.0–15.0)
Lymphocytes Relative: 43.3 % (ref 12.0–46.0)
Lymphs Abs: 1.5 10*3/uL (ref 0.7–4.0)
MCHC: 34.4 g/dL (ref 30.0–36.0)
MCV: 89.9 fl (ref 78.0–100.0)
Monocytes Absolute: 0.3 10*3/uL (ref 0.1–1.0)
Monocytes Relative: 8.1 % (ref 3.0–12.0)
Neutro Abs: 1.5 10*3/uL (ref 1.4–7.7)
Neutrophils Relative %: 44.8 % (ref 43.0–77.0)
Platelets: 253 10*3/uL (ref 150.0–400.0)
RBC: 4.1 Mil/uL (ref 3.87–5.11)
RDW: 12.6 % (ref 11.5–15.5)
WBC: 3.4 10*3/uL — ABNORMAL LOW (ref 4.0–10.5)

## 2021-07-10 LAB — TSH: TSH: 1.27 u[IU]/mL (ref 0.35–5.50)

## 2021-07-10 LAB — T4, FREE: Free T4: 0.78 ng/dL (ref 0.60–1.60)

## 2021-07-10 MED ORDER — PROPRANOLOL HCL 40 MG PO TABS: 20.0000 mg | ORAL_TABLET | Freq: Two times a day (BID) | ORAL | 0 refills | Status: DC

## 2021-07-10 NOTE — Patient Instructions (Signed)
It was good to see you again today.  Please go to the lab and I will call with results.  I have sent a referral to cardiology and neurology for you.  I would also like for you to start on propranolol 40 mg 1/2 tablet evening for 1 week.  Then you can increase to 1/2 tablet twice daily for 1 week.  Keep an eye on your blood pressure and heart rate.  Let me know if you have any problems with this medicine.  The idea is to help keep your headaches away and keep you off Excedrin, which could be driving your anxiety because of the caffeine content as well. ? ?I would like for you to please keep track of headaches and symptoms.  Lets plan on close follow-up in the next 3 to 4 weeks. ? ?Again do not hesitate to reach out any sooner with any concerns or questions. ?

## 2021-07-10 NOTE — Progress Notes (Signed)
? ?Subjective:  ? ? Patient ID: Michele Arias, female    DOB: [birth year], 36 y.o.   MRN: [id number] ? ?Chief Complaint  ?Patient presents with  ? Anxiety  ? ? ?Anxiety ? ? ? ?36 yo pleasant female patient is in today for follow-up on recurrent panic attacks, headaches, and palpitations. ? ?ED visit 06/19/21 after she felt a panic attack coming on while eating lunch at work.  She went outside to walk and then collapsed.  She was taken to the ER by EMS. She was given hydroxyzine to take as needed. Took one tablet yesterday and says it made her go to sleep.  She had this event that took her to the ED in November. ? ?Last night her heart started racing just while watching TV. Breathing exercises didn't help. Pacing helped. ? ?Walking sometimes she gets off-balance. Having more migraines recently and squeezing the back of her head. Usually every other day. Has to turn the lights off. Has been taking Excedrin every other day. Feels jittery afterwards.  ? ?-Stopped taking Ashwagandha gummies. ?-Had a referral for anxiety, but did not go through with counseling because she was nervous about talking to somebody.  States that life is good overall and she works at [employer], married, kids.  No major concerns going on right now or any stressors, wondering if this has to do with anything from her past, but she does not mention anything specific. ?-Has never taken any daily preventative medications.  Very nervous about medications and does not want to feel like a zombie. ? ? ?Past Medical History:  ?Diagnosis Date  ? Anxiety   ? Depression   ? Medical history non-contributory   ? UTI (urinary tract infection)   ? ? ?Past Surgical History:  ?Procedure Laterality Date  ? NO PAST SURGERIES    ? ? ?Family History  ?Problem Relation Age of Onset  ? Alcohol abuse Mother   ? Depression Mother   ? Drug abuse Mother   ? Hypertension Mother   ? Mental illness Mother   ? Bipolar disorder Mother   ? Alcohol abuse Father   ? Drug abuse  Sister   ? Mental illness Sister   ? Alcohol abuse Brother   ? Depression Brother   ? Mental illness Brother   ? Cancer Maternal Grandmother   ? Hypertension Maternal Grandmother   ? Stroke Maternal Grandmother   ? Cancer Paternal Grandmother   ? Miscarriages / Stillbirths Sister   ? Alcohol abuse Brother   ? Drug abuse Brother   ? ? ?Social History  ? ?Tobacco Use  ? Smoking status: Never  ? Smokeless tobacco: Never  ?Vaping Use  ? Vaping Use: Never used  ?Substance Use Topics  ? Alcohol use: Yes  ?  Comment: rarely  ? Drug use: No  ?  ? ?No Known Allergies ? ?Review of Systems ?NEGATIVE UNLESS OTHERWISE INDICATED IN HPI ? ? ?   ?Objective:  ?  ? ?BP 118/82   Pulse 78   Temp 98.2 ?F (36.8 ?C)   Wt 141 lb 8 oz (64.2 kg)   LMP 06/27/2021   SpO2 98%   BMI 22.16 kg/m?  ? ?Wt Readings from Last 3 Encounters:  ?07/10/21 141 lb 8 oz (64.2 kg)  ?06/19/21 140 lb (63.5 kg)  ?03/21/21 145 lb (65.8 kg)  ? ? ?BP Readings from Last 3 Encounters:  ?07/10/21 118/82  ?06/19/21 105/76  ?03/21/21 105/70  ?  ? ?Physical Exam ?Vitals  and nursing note reviewed.  ?Constitutional:   ?   Appearance: Normal appearance. She is normal weight. She is not toxic-appearing.  ?HENT:  ?   Head: Normocephalic and atraumatic.  ?   Right Ear: External ear normal.  ?   Left Ear: External ear normal.  ?   Nose: Nose normal.  ?   Mouth/Throat:  ?   Mouth: Mucous membranes are moist.  ?Eyes:  ?   Extraocular Movements: Extraocular movements intact.  ?   Conjunctiva/sclera: Conjunctivae normal.  ?   Pupils: Pupils are equal, round, and reactive to light.  ?Cardiovascular:  ?   Rate and Rhythm: Normal rate and regular rhythm.  ?   Pulses: Normal pulses.  ?   Heart sounds: Normal heart sounds.  ?Pulmonary:  ?   Effort: Pulmonary effort is normal.  ?   Breath sounds: Normal breath sounds.  ?Musculoskeletal:     ?   General: Normal range of motion.  ?   Cervical back: Normal range of motion and neck supple.  ?Skin: ?   General: Skin is warm and dry.   ?Neurological:  ?   General: No focal deficit present.  ?   Mental Status: She is alert and oriented to person, place, and time.  ?   Cranial Nerves: No cranial nerve deficit.  ?   Motor: No weakness.  ?   Gait: Gait normal.  ?Psychiatric:     ?   Mood and Affect: Mood normal.     ?   Behavior: Behavior normal.     ?   Thought Content: Thought content normal.     ?   Judgment: Judgment normal.  ? ? ?   ?Assessment & Plan:  ? ?Problem List Items Addressed This Visit   ?None ?Visit Diagnoses   ? ? Migraine without aura and without status migrainosus, not intractable    -  Primary  ? Relevant Medications  ? propranolol (INDERAL) 40 MG tablet  ? Other Relevant Orders  ? CBC with Differential/Platelet  ? Comprehensive metabolic panel  ? TSH  ? T4, free  ? Iron, TIBC and Ferritin Panel  ? Ambulatory referral to Neurology  ? Palpitations      ? Relevant Orders  ? CBC with Differential/Platelet  ? Comprehensive metabolic panel  ? TSH  ? T4, free  ? Ambulatory referral to Cardiology  ? Panic attacks      ? Other iron deficiency anemia      ? Relevant Orders  ? CBC with Differential/Platelet  ? Iron, TIBC and Ferritin Panel  ? ?  ? ? ? ?Meds ordered this encounter  ?Medications  ? propranolol (INDERAL) 40 MG tablet  ?  Sig: Take 0.5 tablets (20 mg total) by mouth 2 (two) times daily.  ?  Dispense:  30 tablet  ?  Refill:  0  ? ?Plan: ?-Pt requesting referral to neurology and cardiology, will oblige. ?-Discussed counseling and medication options for helping with her underlying anxiety, patient still hesitant and not wanting to go through with this at this time.  She wants to make sure nothing underlying going on. ?-History of iron deficiency anemia, recheck labs today and treat accordingly.  Could contribute to some of her palpitations. ?-Recurrent Excedrin use could be driving palpitations as well. ?-Plan to start her on propranolol to help with migraine prevention, palpitations, anxiety.  Plan for low titration as discussed  in AVS. ? ?-Patient to have close follow-up with me in the  next 3 to 4 weeks. ? ?This note was prepared with assistance of Dragon voice recognition software. Occasional wrong-word or sound-a-like substitutions may have occurred due to the inherent limitations of voice recognition software. ? ?Time Spent: ?33 minutes of total time was spent on the date of the encounter performing the following actions: chart review prior to seeing the patient, obtaining history, performing a medically necessary exam, counseling on the treatment plan, placing orders, and documenting in our EHR.   ? ?Alphonsa Brickle M Kacyn Souder, PA-C ?

## 2021-07-11 LAB — IRON,TIBC AND FERRITIN PANEL
%SAT: 18 % (calc) (ref 16–45)
Ferritin: 5 ng/mL — ABNORMAL LOW (ref 16–154)
Iron: 71 ug/dL (ref 40–190)
TIBC: 393 mcg/dL (calc) (ref 250–450)

## 2021-07-23 ENCOUNTER — Ambulatory Visit (INDEPENDENT_AMBULATORY_CARE_PROVIDER_SITE_OTHER): Payer: No Typology Code available for payment source

## 2021-07-23 ENCOUNTER — Ambulatory Visit: Payer: No Typology Code available for payment source | Admitting: Interventional Cardiology

## 2021-07-23 ENCOUNTER — Encounter: Payer: Self-pay | Admitting: Interventional Cardiology

## 2021-07-23 VITALS — BP 100/64 | HR 80 | Ht 67.0 in | Wt 142.0 lb

## 2021-07-23 DIAGNOSIS — R55 Syncope and collapse: Secondary | ICD-10-CM

## 2021-07-23 DIAGNOSIS — R002 Palpitations: Secondary | ICD-10-CM | POA: Diagnosis not present

## 2021-07-23 DIAGNOSIS — F410 Panic disorder [episodic paroxysmal anxiety] without agoraphobia: Secondary | ICD-10-CM

## 2021-07-23 NOTE — Progress Notes (Signed)
?  ?Cardiology Office Note ? ? ?Date:  07/23/2021  ? ?ID:  Michele Arias, DOB 03-25-1986, MRN [id number] ? ?PCP:  Allwardt, Randa Evens, PA-C  ? ? ?Chief Complaint  ?Patient presents with  ? Follow-up  ?  Chest pain for 5 months; getting worse   ? ?Palpitations ? ?Wt Readings from Last 3 Encounters:  ?07/23/21 142 lb (64.4 kg)  ?07/10/21 141 lb 8 oz (64.2 kg)  ?06/19/21 140 lb (63.5 kg)  ?  ? ?  ?History of Present Illness: ?Michele Arias is a 36 y.o. female who is being seen today for the evaluation of palpitations at the request of Allwardt, Alyssa M, PA-C.  ? ?She describes a rapid heart beat intermittently, associated with a sharp pain as well.  Started 5-6 months ago.  R gets up to 140 bpm by her watch.  Getting more frequent.  Happens every other day.  Lasts 5 minutes at a time.   ? ?She had a panic attack 3-4 ago and she passed out.  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  She walked outside and then passed out for a second.  No injuries.   ? ? ? ?Past Medical History:  ?Diagnosis Date  ? Anxiety   ? Depression   ? Medical history non-contributory   ? UTI (urinary tract infection)   ? ? ?Past Surgical History:  ?Procedure Laterality Date  ? NO PAST SURGERIES    ? ? ? ?Current Outpatient Medications  ?Medication Sig Dispense Refill  ? hydrOXYzine (ATARAX) 25 MG tablet Take 1 tablet (25 mg total) by mouth every 6 (six) hours as needed for anxiety. 12 tablet 0  ? propranolol (INDERAL) 40 MG tablet Take 0.5 tablets (20 mg total) by mouth 2 (two) times daily. 30 tablet 0  ? ?No current facility-administered medications for this visit.  ? ? ?Allergies:   Patient has no known allergies.  ? ? ?Social History:  The patient  reports that she has never smoked. She has never used smokeless tobacco. She reports current alcohol use. She reports that she does not use drugs.  ? ?Family History:  The patient's family history includes Alcohol abuse in her brother, brother, father, and mother; Bipolar  disorder in her mother; Cancer in her maternal grandmother and paternal grandmother; Depression in her brother and mother; Drug abuse in her brother, mother, and sister; Hypertension in her maternal grandmother and mother; Mental illness in her brother, mother, and sister; Miscarriages / Stillbirths in her sister; Stroke in her maternal grandmother.  ? ? ?ROS:  Please see the history of present illness.   Otherwise, review of systems are positive for palpitations.   All other systems are reviewed and negative.  ? ? ?PHYSICAL EXAM: ?VS:  BP 100/64   Pulse 80   Ht 5\' 7"  (1.702 m)   Wt 142 lb (64.4 kg)   LMP 06/27/2021   SpO2 99%   BMI 22.24 kg/m?  , BMI Body mass index is 22.24 kg/m?. ?GEN: Well nourished, well developed, in no acute distress ?HEENT: normal ?Neck: no JVD, carotid bruits, or masses ?Cardiac: RRR; no murmurs, rubs, or gallops,no edema  ?Respiratory:  clear to auscultation bilaterally, normal work of breathing ?GI: soft, nontender, nondistended, + BS ?MS: no deformity or atrophy ?Skin: warm and dry, no rash ?Neuro:  Strength and sensation are intact ?Psych: euthymic mood, full affect ? ? ?EKG:   ?The ekg ordered today demonstrates normal ECG ? ? ?Recent Labs: ?  07/10/2021: ALT 10; BUN 14; Creatinine, Ser 0.77; Hemoglobin 12.7; Platelets 253.0; Potassium 3.9; Sodium 139; TSH 1.27  ? ?Lipid Panel ?   ?Component Value Date/Time  ? CHOL 150 11/18/2020 1114  ? TRIG 97.0 11/18/2020 1114  ? HDL 40.90 11/18/2020 1114  ? CHOLHDL 4 11/18/2020 1114  ? VLDL 19.4 11/18/2020 1114  ? [place] 90 11/18/2020 1114  ? ?  ?Other studies Reviewed: ?Additional studies/ records that were reviewed today with results demonstrating: labs reviewed; TSH normal. ? ? ?ASSESSMENT AND PLAN: ? ?Palpitations:  CP associated with anxiety and palpitations.  She has decreased caffeine, but no change in palpitations are occurring at least 3-4 times a week.  Plan for 2 week Zio patch to r/o SVT. ?Panic attacks: she is not taking any meds  for this.   ?Syncope: Check an echocardiogram to evaluate for any structural issues with a heart.  Stay well hydrated.  ? ? ?Current medicines are reviewed at length with the patient today.  The patient concerns regarding her medicines were addressed. ? ?The following changes have been made:  No change ? ?Labs/ tests ordered today include:  ?No orders of the defined types were placed in this encounter. ? ? ?Recommend 150 minutes/week of aerobic exercise ?Low fat, low carb, high fiber diet recommended ? ?Disposition:   FU based on test results ? ? ?Signed, ?Larae Grooms, MD  ?07/23/2021 2:54 PM    ?[facility] ?[address], [place], [place]  [id number] ?Phone: [phone]; Fax: [phone]  ? ?

## 2021-07-23 NOTE — Patient Instructions (Signed)
Medication Instructions:  ?Your physician recommends that you continue on your current medications as directed. Please refer to the Current Medication list given to you today. ? ? ?*If you need a refill on your cardiac medications before your next appointment, please call your pharmacy* ? ? ?Lab Work: ?none ?If you have labs (blood work) drawn today and your tests are completely normal, you will receive your results only by: ?MyChart Message (if you have MyChart) OR ?A paper copy in the mail ?If you have any lab test that is abnormal or we need to change your treatment, we will call you to review the results. ? ? ?Testing/Procedures: ?Your physician has requested that you have an echocardiogram. Echocardiography is a painless test that uses sound waves to create images of your heart. It provides your doctor with information about the size and shape of your heart and how well your heart?s chambers and valves are working. This procedure takes approximately one hour. There are no restrictions for this procedure. ? ?Dr Eldridge Dace recommends you wear a 2 week zio monitor ? ? ?Follow-Up: ?At [facility], you and your health needs are our priority.  As part of our continuing mission to provide you with exceptional heart care, we have created designated Provider Care Teams.  These Care Teams include your primary Cardiologist (physician) and Advanced Practice Providers (APPs -  Physician Assistants and Nurse Practitioners) who all work together to provide you with the care you need, when you need it. ? ?We recommend signing up for the patient portal called "MyChart".  Sign up information is provided on this After Visit Summary.  MyChart is used to connect with patients for Virtual Visits (Telemedicine).  Patients are able to view lab/test results, encounter notes, upcoming appointments, etc.  Non-urgent messages can be sent to your provider as well.   ?To learn more about what you can do with MyChart, go to  [email or web address].   ? ?Your next appointment:   ?As needed ? ?The format for your next appointment:   ?In Person ? ?Provider:   ?Lance Muss, MD   ? ? ?Other Instructions ?ZIO XT- Long Term Monitor Instructions ? ?Your physician has requested you wear a ZIO patch monitor for 14 days.  ?This is a single patch monitor. [employer] supplies one patch monitor per enrollment. Additional ?stickers are not available. Please do not apply patch if you will be having a Nuclear Stress Test,  ?Echocardiogram, Cardiac CT, MRI, or Chest Xray during the period you would be wearing the  ?monitor. The patch cannot be worn during these tests. You cannot remove and re-apply the  ?ZIO XT patch monitor.  ?Your ZIO patch monitor will be mailed 3 day USPS to your address on file. It may take 3-5 days  ?to receive your monitor after you have been enrolled.  ?Once you have received your monitor, please review the enclosed instructions. Your monitor  ?has already been registered assigning a specific monitor serial # to you. ? ?Billing and Patient Assistance Program Information ? ?We have supplied [employer] with any of your insurance information on file for billing purposes. ?[employer] offers a sliding scale Patient Assistance Program for patients that do not have  ?insurance, or whose insurance does not completely cover the cost of the ZIO monitor.  ?You must apply for the Patient Assistance Program to qualify for this discounted rate.  ?To apply, please call [employer] at [phone], select option 4, select option 2, ask to apply for  ?Patient Assistance Program.  [employer] will ask your household income, and how many people  ?are in your household. They will quote your out-of-pocket cost based on that information.  ?[employer] will also be able to set up a 68-month, interest-free payment plan if needed. ? ?Applying the monitor ?  ?Shave hair from upper left chest.  ?Hold abrader disc by orange tab. Rub abrader in 40 strokes over the upper  left chest as  ?indicated in your monitor instructions.  ?Clean area with 4 enclosed alcohol pads. Let dry.  ?Apply patch as indicated in monitor instructions. Patch will be placed under collarbone on left  ?side of chest with arrow pointing upward.  ?Rub patch adhesive wings for 2 minutes. Remove white label marked "1". Remove the white  ?label marked "2". Rub patch adhesive wings for 2 additional minutes.  ?While looking in a mirror, press and release button in center of patch. A small green light will  ?flash 3-4 times. This will be your only indicator that the monitor has been turned on.  ?Do not shower for the first 24 hours. You may shower after the first 24 hours.  ?Press the button if you feel a symptom. You will hear a small click. Record Date, Time and  ?Symptom in the Patient Logbook.  ?When you are ready to remove the patch, follow instructions on the last 2 pages of Patient  ?Logbook. Stick patch monitor onto the last page of Patient Logbook.  ?Place Patient Logbook in the blue and white box. Use locking tab on box and tape box closed  ?securely. The blue and white box has prepaid postage on it. Please place it in the mailbox as  ?soon as possible. Your physician should have your test results approximately 7 days after the  ?monitor has been mailed back to [facility].  ?Call [facility] at [phone] if you have questions regarding  ?your ZIO XT patch monitor. Call them immediately if you see an orange light blinking on your  ?monitor.  ?If your monitor falls off in less than 4 days, contact our Monitor department at [phone].  ?If your monitor becomes loose or falls off after 4 days call [employer] at [phone] for  ?suggestions on securing your monitor ? ? ?

## 2021-07-23 NOTE — Progress Notes (Unsigned)
Enrolled patient for a 14 day Zio XT  monitor to be mailed to patients home  °

## 2021-08-05 ENCOUNTER — Ambulatory Visit (HOSPITAL_COMMUNITY): Payer: No Typology Code available for payment source | Attending: Cardiology

## 2021-08-05 DIAGNOSIS — R55 Syncope and collapse: Secondary | ICD-10-CM

## 2021-08-05 DIAGNOSIS — R002 Palpitations: Secondary | ICD-10-CM

## 2021-08-05 LAB — ECHOCARDIOGRAM COMPLETE
Area-P 1/2: 3.76 cm2
S' Lateral: 2.8 cm

## 2021-08-07 ENCOUNTER — Ambulatory Visit: Payer: No Typology Code available for payment source | Admitting: Physician Assistant

## 2021-08-08 DIAGNOSIS — R55 Syncope and collapse: Secondary | ICD-10-CM | POA: Diagnosis not present

## 2021-08-08 DIAGNOSIS — R002 Palpitations: Secondary | ICD-10-CM | POA: Diagnosis not present

## 2021-08-11 ENCOUNTER — Encounter: Payer: Self-pay | Admitting: Interventional Cardiology

## 2021-08-12 NOTE — Telephone Encounter (Signed)
Monitor was ordered for 14 days.  If she chooses to remove the monitor prematurely based on her skin irritation, they can still process whatever data was recorded, so she should still mail it in to be processed.  If she removes today she would be billed the 3-7 day long term monitor instead of the 8-14 day.  [employer] does not offer a sensitive skin alternative.  ?Thanks,  ?Shelly  ?

## 2021-09-30 ENCOUNTER — Ambulatory Visit: Payer: No Typology Code available for payment source | Admitting: Physician Assistant

## 2021-11-03 NOTE — Progress Notes (Unsigned)
NEUROLOGY CONSULTATION NOTE  Michele Arias MRN: [id number] DOB: 05-31-1985  Referring provider: Ila Mcgill, PA-C Primary care provider: Ila Mcgill, PA-C  Reason for consult:  migraine  Assessment/Plan:   Migraine without aura, without status migrainosus, not intractable Vertigo - may be vestibular migraine Worsening headache  MRI of brain with and without contrast to evaluate for secondary intracranial etiology for worsening headaches and vertigo Migraine prevention:  She would like to work on lifestyle modification and supplements first (magnesium citrate, riboflavin, CoQ-10) Migraine rescue:  aspirin Limit use of pain relievers to no more than 2 days out of week to prevent risk of rebound or medication-overuse headache. Keep headache diary Follow up 4 months.    Subjective:  Michele Arias is a 36 year old female with history of panic attacks who presents for migraines.  History supplemented by referring provider's note.  She has been having significant anxiety, experiencing recurrent panic attacks.  She has been experiencing palpitations which is being worked up by cardiology.  For over a year, she has had near constant pressure in back of head.  For same period of time, having dizzy spells.  Spinning spells lasting up to 30 minutes.  Associated nausea.  Sometimes pressure will start after a dizzy spell.  They occur 3 times a month.  Usually does not occur with her migraines.  Migraines are described as severe bi-temporal and retro-orbital pounding.  Associated with photophobia and phonophobia, blurred vision and sometimes nausea.  No unilateral numbness or weakness.  Typically last up to an hour with ASA.  They occur 2 days a week.    Head pressure, migraines and vertigo have been getting worse.  She is concerned about a brain tumor which may be contributing to her anxiety.  Current NSAIDS/analgesics: ASA Current triptans:  none Current ergotamine:   none Current anti-emetic:  none Current muscle relaxants:  none Current Antihypertensive medications:  none Current Antidepressant medications:  none Current Anticonvulsant medications:  none Current anti-CGRP:  none Other therapy:  none Birth control:  none Other medications:  hydroxyzine  Past NSAIDS/analgesics:  Excedrin Migraine, ibuprofen Past abortive triptans:  none Past abortive ergotamine:  none Past muscle relaxants:  methocarbamol Past anti-emetic:  none Past antihypertensive medications:  propranolol Past antidepressant medications:  Wellbutrin Past anticonvulsant medications:  none Past anti-CGRP:  none Other past therapies:  none  Caffeine:  No caffeine as it may have been contributing to panic attacks Diet:  Tries to drink a lot of water.  Drinks soda.  May skip meals - small breakfast sometimes.   Exercise:  no Depression:  no; Anxiety:  yes Sleep hygiene:  okay Family history of headache:  not aware  Labs from March revealed unremarkable CBC, CMP, TSH 1.27 but iron studies with low ferritin of 5.  Advised to an iron supplement which she has since discontinued.     PAST MEDICAL HISTORY: Past Medical History:  Diagnosis Date   Anxiety    Depression    Medical history non-contributory    UTI (urinary tract infection)     PAST SURGICAL HISTORY: Past Surgical History:  Procedure Laterality Date   NO PAST SURGERIES      MEDICATIONS: Current Outpatient Medications on File Prior to Visit  Medication Sig Dispense Refill   hydrOXYzine (ATARAX) 25 MG tablet Take 1 tablet (25 mg total) by mouth every 6 (six) hours as needed for anxiety. 12 tablet 0   propranolol (INDERAL) 40 MG tablet Take 0.5 tablets (20 mg total) by  mouth 2 (two) times daily. 30 tablet 0   No current facility-administered medications on file prior to visit.    ALLERGIES: No Known Allergies  FAMILY HISTORY: Family History  Problem Relation Age of Onset   Alcohol abuse Mother     Depression Mother    Drug abuse Mother    Hypertension Mother    Mental illness Mother    Bipolar disorder Mother    Alcohol abuse Father    Drug abuse Sister    Mental illness Sister    Alcohol abuse Brother    Depression Brother    Mental illness Brother    Cancer Maternal Grandmother    Hypertension Maternal Grandmother    Stroke Maternal Grandmother    Cancer Paternal Grandmother    Miscarriages / Stillbirths Sister    Alcohol abuse Brother    Drug abuse Brother     Objective:  Blood pressure 115/71, pulse 88, height 5\' 6"  (1.676 m), weight 139 lb (63 kg), SpO2 100 %. General: No acute distress.  Patient appears well-groomed.   Head:  Normocephalic/atraumatic Eyes:  fundi examined but not visualized Neck: supple, no paraspinal tenderness, full range of motion Heart: regular rate and rhythm Neurological Exam: Mental status: alert and oriented to person, place, and time, recent and remote memory intact, fund of knowledge intact, attention and concentration intact, speech fluent and not dysarthric, language intact. Cranial nerves: CN I: not tested CN II: pupils equal, round and reactive to light, visual fields intact CN III, IV, VI:  full range of motion, no nystagmus, no ptosis CN V: facial sensation intact. CN VII: upper and lower face symmetric CN VIII: hearing intact CN IX, X: gag intact, uvula midline CN XI: sternocleidomastoid and trapezius muscles intact CN XII: tongue midline Bulk & Tone: normal, no fasciculations. Motor:  muscle strength 5/5 throughout Sensation:  Pinprick, temperature and vibratory sensation intact. Deep Tendon Reflexes:  2+ throughout,  toes downgoing.   Finger to nose testing:  Without dysmetria.   Heel to shin:  Without dysmetria.   Gait:  Normal station and stride.  Romberg negative.    Thank you for allowing me to take part in the care of this patient.  , DO  CC: Shon Millet Allwardt, PA-C

## 2021-11-04 ENCOUNTER — Ambulatory Visit (INDEPENDENT_AMBULATORY_CARE_PROVIDER_SITE_OTHER): Payer: No Typology Code available for payment source | Admitting: Neurology

## 2021-11-04 ENCOUNTER — Encounter: Payer: Self-pay | Admitting: Neurology

## 2021-11-04 VITALS — BP 115/71 | HR 88 | Ht 66.0 in | Wt 139.0 lb

## 2021-11-04 DIAGNOSIS — R42 Dizziness and giddiness: Secondary | ICD-10-CM

## 2021-11-04 DIAGNOSIS — G43009 Migraine without aura, not intractable, without status migrainosus: Secondary | ICD-10-CM

## 2021-11-04 DIAGNOSIS — R519 Headache, unspecified: Secondary | ICD-10-CM | POA: Diagnosis not present

## 2021-11-04 NOTE — Patient Instructions (Addendum)
We will check MRI of brain with and without contrast. We have sent a referral to [facility] for your MRI and they will call you directly to schedule your appointment. They are located at [address] [place]. If you need to contact them directly please call [phone].  Limit use of pain relievers to no more than 2 days out of the week.  These medications include acetaminophen, NSAIDs (ibuprofen/Advil/Motrin, naproxen/Aleve, triptans (Imitrex/sumatriptan), Excedrin, and narcotics.  This will help reduce risk of rebound headaches. Be aware of common food triggers:  - Caffeine:  coffee, black tea, cola, Mt. Dew  - Chocolate  - Dairy:  aged cheeses (brie, blue, cheddar, gouda, [place], provolone, [place], Swiss, etc), chocolate milk, buttermilk, sour cream, limit eggs and yogurt  - Nuts, peanut butter  - Alcohol  - Cereals/grains:  FRESH breads (fresh bagels, sourdough, doughnuts), yeast productions  - Processed/canned/aged/cured meats (pre-packaged deli meats, hotdogs)  - MSG/glutamate:  soy sauce, flavor enhancer, pickled/preserved/marinated foods  - Sweeteners:  aspartame (Equal, Nutrasweet).  Sugar and Splenda are okay  - Vegetables:  legumes (lima beans, lentils, snow peas, fava beans, pinto peans, peas, garbanzo beans), sauerkraut, onions, olives, pickles  - Fruit:  avocados, bananas, citrus fruit (orange, lemon, grapefruit), mango  - Other:  Frozen meals, macaroni and cheese Routine exercise Stay adequately hydrated (aim for 64 oz water daily) Keep headache diary Maintain proper stress management Maintain proper sleep hygiene Do not skip meals Consider supplements:  magnesium citrate 400mg  daily, riboflavin 400mg  daily, coenzyme Q10 100mg  three times daily.

## 2021-11-15 ENCOUNTER — Ambulatory Visit
Admission: RE | Admit: 2021-11-15 | Discharge: 2021-11-15 | Disposition: A | Discharge status: Home / Self Care | Payer: No Typology Code available for payment source | Source: Ambulatory Visit | Attending: Neurology | Admitting: Neurology

## 2021-11-15 DIAGNOSIS — R519 Headache, unspecified: Secondary | ICD-10-CM

## 2021-11-15 DIAGNOSIS — G43009 Migraine without aura, not intractable, without status migrainosus: Secondary | ICD-10-CM

## 2021-11-15 DIAGNOSIS — R42 Dizziness and giddiness: Secondary | ICD-10-CM

## 2021-11-15 MED ORDER — GADOBENATE DIMEGLUMINE 529 MG/ML IV SOLN
12.0000 mL | Freq: Once | INTRAVENOUS | Status: AC | PRN
  Administered 2021-11-15: 12 mL via INTRAVENOUS

## 2021-11-19 ENCOUNTER — Ambulatory Visit (INDEPENDENT_AMBULATORY_CARE_PROVIDER_SITE_OTHER): Payer: No Typology Code available for payment source | Admitting: Physician Assistant

## 2021-11-19 ENCOUNTER — Encounter: Payer: Self-pay | Admitting: Physician Assistant

## 2021-11-19 VITALS — BP 102/68 | HR 82 | Temp 98.3°F | Ht 66.0 in | Wt 138.6 lb

## 2021-11-19 DIAGNOSIS — Z0000 Encounter for general adult medical examination without abnormal findings: Secondary | ICD-10-CM

## 2021-11-19 NOTE — Progress Notes (Signed)
Subjective:    Patient ID: Michele Arias, female    DOB: [birth year], 36 y.o.   MRN: [id number]  Chief Complaint  Patient presents with   Annual Exam    Pt in for annual CPE; pt is not fasting and no other concerns to discuss    HPI Patient is in today for annual exam. Moving to [place] with her family today. Her husband is from there originally and will be able to do virtual work. His family is there. She will take a year off work. Kids are excited to go.   Acute concerns: No health concerns  Health maintenance: Lifestyle/ exercise: Doing well Nutrition: Doing well  Mental health: Feeling so much better since good reports from neurology and cardiology Caffeine: Cut back on this due to palpitations; still drinking sodas Sleep: Doing better Substance use: None  ETOH: None Sexual activity: Monogamous  Immunizations: UTD Pap: UTD    Past Medical History:  Diagnosis Date   Anxiety    Depression    Medical history non-contributory    UTI (urinary tract infection)     Past Surgical History:  Procedure Laterality Date   NO PAST SURGERIES      Family History  Problem Relation Age of Onset   Alcohol abuse Mother    Depression Mother    Drug abuse Mother    Hypertension Mother    Mental illness Mother    Bipolar disorder Mother    Alcohol abuse Father    Drug abuse Sister    Mental illness Sister    Alcohol abuse Brother    Depression Brother    Mental illness Brother    Cancer Maternal Grandmother    Hypertension Maternal Grandmother    Stroke Maternal Grandmother    Cancer Paternal Grandmother    Miscarriages / Stillbirths Sister    Alcohol abuse Brother    Drug abuse Brother     Social History   Tobacco Use   Smoking status: Never   Smokeless tobacco: Never  Vaping Use   Vaping Use: Never used  Substance Use Topics   Alcohol use: Yes    Comment: rarely   Drug use: No     No Known Allergies  Review of Systems NEGATIVE UNLESS OTHERWISE  INDICATED IN HPI      Objective:     BP 102/68 (BP Location: Left Arm)   Pulse 82   Temp 98.3 F (36.8 C) (Temporal)   Ht 5\' 6"  (1.676 m)   Wt 138 lb 9.6 oz (62.9 kg)   LMP 10/27/2021 (Exact Date)   SpO2 100%   BMI 22.37 kg/m   Wt Readings from Last 3 Encounters:  11/19/21 138 lb 9.6 oz (62.9 kg)  11/04/21 139 lb (63 kg)  07/23/21 142 lb (64.4 kg)    BP Readings from Last 3 Encounters:  11/19/21 102/68  11/04/21 115/71  07/23/21 100/64     Physical Exam Vitals and nursing note reviewed.  Constitutional:      Appearance: Normal appearance. She is normal weight. She is not toxic-appearing.  HENT:     Head: Normocephalic and atraumatic.     Right Ear: Tympanic membrane, ear canal and external ear normal.     Left Ear: Tympanic membrane, ear canal and external ear normal.     Nose: Nose normal.     Mouth/Throat:     Mouth: Mucous membranes are moist.  Eyes:     Extraocular Movements: Extraocular movements intact.  Conjunctiva/sclera: Conjunctivae normal.     Pupils: Pupils are equal, round, and reactive to light.  Cardiovascular:     Rate and Rhythm: Normal rate and regular rhythm.     Pulses: Normal pulses.     Heart sounds: Normal heart sounds.  Pulmonary:     Effort: Pulmonary effort is normal.     Breath sounds: Normal breath sounds.  Abdominal:     General: Abdomen is flat. Bowel sounds are normal.     Palpations: Abdomen is soft.  Musculoskeletal:        General: Normal range of motion.     Cervical back: Normal range of motion and neck supple.  Skin:    General: Skin is warm and dry.  Neurological:     General: No focal deficit present.     Mental Status: She is alert and oriented to person, place, and time.  Psychiatric:        Mood and Affect: Mood normal.        Behavior: Behavior normal.        Thought Content: Thought content normal.        Judgment: Judgment normal.        Assessment & Plan:   Problem List Items Addressed This  Visit   None Visit Diagnoses     Encounter for annual physical exam    -  Primary       Plan: Age-appropriate screening and counseling performed today. Preventive measures discussed and printed in AVS for patient. She is UTD with female care. Declined labs as she will be getting on a plane for [place] today. She will keep me posted in MyChart.    Patient Counseling: [x]   Nutrition: Stressed importance of moderation in sodium/caffeine intake, saturated fat and cholesterol, caloric balance, sufficient intake of fresh fruits, vegetables, and fiber.  [x]   Stressed the importance of regular exercise.   [x]   Substance Abuse: Discussed cessation/primary prevention of tobacco, alcohol, or other drug use; driving or other dangerous activities under the influence; availability of treatment for abuse.   []   Injury prevention: Discussed safety belts, safety helmets, smoke detector, smoking near bedding or upholstery.   []   Sexuality: Discussed sexually transmitted diseases, partner selection, use of condoms, avoidance of unintended pregnancy  and contraceptive alternatives.   [x]   Dental health: Discussed importance of regular tooth brushing, flossing, and dental visits.  [x]   Health maintenance and immunizations reviewed. Please refer to Health maintenance section.       Daquana Paddock M Ellissa Ayo, PA-C

## 2022-01-19 ENCOUNTER — Encounter: Payer: Self-pay | Admitting: *Deleted

## 2022-03-09 NOTE — Progress Notes (Deleted)
NEUROLOGY FOLLOW UP OFFICE NOTE  Pranathi Winfree [id number]  Assessment/Plan:   Migraine without aura, without status migrainosus, not intractable Vertigo - may be vestibular migraine    Migraine prevention:  *** Migraine rescue:  *** Limit use of pain relievers to no more than 2 days out of week to prevent risk of rebound or medication-overuse headache. Keep headache diary Follow up 4 to 5 months.      Subjective:  Jahaira Earnhart is a 36 year old female with history of panic attacks who follows up for migraines.  UPDATE: MRI of brain with and without contrast on 11/15/2021 personally reviewed was normal.  Started vitamins and supplements. Intensity:  *** Duration:  *** Frequency:  *** Frequency of abortive medication: *** Current NSAIDS/analgesics: ASA Current triptans:  none Current ergotamine:  none Current anti-emetic:  none Current muscle relaxants:  none Current Antihypertensive medications:  none Current Antidepressant medications:  none Current Anticonvulsant medications:  none Current anti-CGRP:  none Current vitamins/supplements:  magnesium citrate 400mg , riboflavin 400mg , CoQ10 100mg  TID Other therapy:  none Birth control:  none Other medications:  hydroxyzine  Caffeine:  No caffeine as it may have been contributing to panic attacks Diet:  Tries to drink a lot of water.  Drinks soda.  May skip meals - small breakfast sometimes.   Exercise:  no Depression:  no; Anxiety:  yes Sleep hygiene:  okay  HISTORY:  She has been having significant anxiety, experiencing recurrent panic attacks.  She has been experiencing palpitations which is being worked up by cardiology.  For over a year, she has had near constant pressure in back of head.  For same period of time, having dizzy spells.  Spinning spells lasting up to 30 minutes.  Associated nausea.  Sometimes pressure will start after a dizzy spell.  They occur 3 times a month.  Usually does not occur  with her migraines.  Migraines are described as severe bi-temporal and retro-orbital pounding.  Associated with photophobia and phonophobia, blurred vision and sometimes nausea.  No unilateral numbness or weakness.  Typically last up to an hour with ASA.  They occur 2 days a week.    Head pressure, migraines and vertigo have been getting worse.  She is concerned about a brain tumor which may be contributing to her anxiety.    Past NSAIDS/analgesics:  Excedrin Migraine, ibuprofen Past abortive triptans:  none Past abortive ergotamine:  none Past muscle relaxants:  methocarbamol Past anti-emetic:  none Past antihypertensive medications:  propranolol Past antidepressant medications:  Wellbutrin Past anticonvulsant medications:  none Past anti-CGRP:  none Other past therapies:  none    Family history of headache:  not aware  PAST MEDICAL HISTORY: Past Medical History:  Diagnosis Date   Anxiety    Depression    Medical history non-contributory    UTI (urinary tract infection)     MEDICATIONS: Current Outpatient Medications on File Prior to Visit  Medication Sig Dispense Refill   hydrOXYzine (ATARAX) 25 MG tablet Take 1 tablet (25 mg total) by mouth every 6 (six) hours as needed for anxiety. 12 tablet 0   No current facility-administered medications on file prior to visit.    ALLERGIES: No Known Allergies  FAMILY HISTORY: Family History  Problem Relation Age of Onset   Alcohol abuse Mother    Depression Mother    Drug abuse Mother    Hypertension Mother    Mental illness Mother    Bipolar disorder Mother    Alcohol abuse Father  Drug abuse Sister    Mental illness Sister    Alcohol abuse Brother    Depression Brother    Mental illness Brother    Cancer Maternal Grandmother    Hypertension Maternal Grandmother    Stroke Maternal Grandmother    Cancer Paternal Grandmother    Miscarriages / Stillbirths Sister    Alcohol abuse Brother    Drug abuse Brother        Objective:  *** General: No acute distress.  Patient appears ***-groomed.   Head:  Normocephalic/atraumatic Eyes:  Fundi examined but not visualized Neck: supple, no paraspinal tenderness, full range of motion Heart:  Regular rate and rhythm Lungs:  Clear to auscultation bilaterally Back: No paraspinal tenderness Neurological Exam: alert and oriented to person, place, and time.  Speech fluent and not dysarthric, language intact.  CN II-XII intact. Bulk and tone normal, muscle strength 5/5 throughout.  Sensation to light touch intact.  Deep tendon reflexes 2+ throughout, toes downgoing.  Finger to nose testing intact.  Gait normal, Romberg negative.   Shon Millet, DO  CC: ***

## 2022-03-10 ENCOUNTER — Ambulatory Visit: Payer: Self-pay | Admitting: Neurology

## 2022-04-09 ENCOUNTER — Encounter: Payer: Self-pay | Admitting: *Deleted

## 2022-07-04 IMAGING — CR DG CHEST 2V
2 series · 2 of 2 positions shown · non-contrast
Comparison: 09/06/2013

CLINICAL DATA: Chest pain

EXAM:
CHEST - 2 VIEW

[w chest pa]
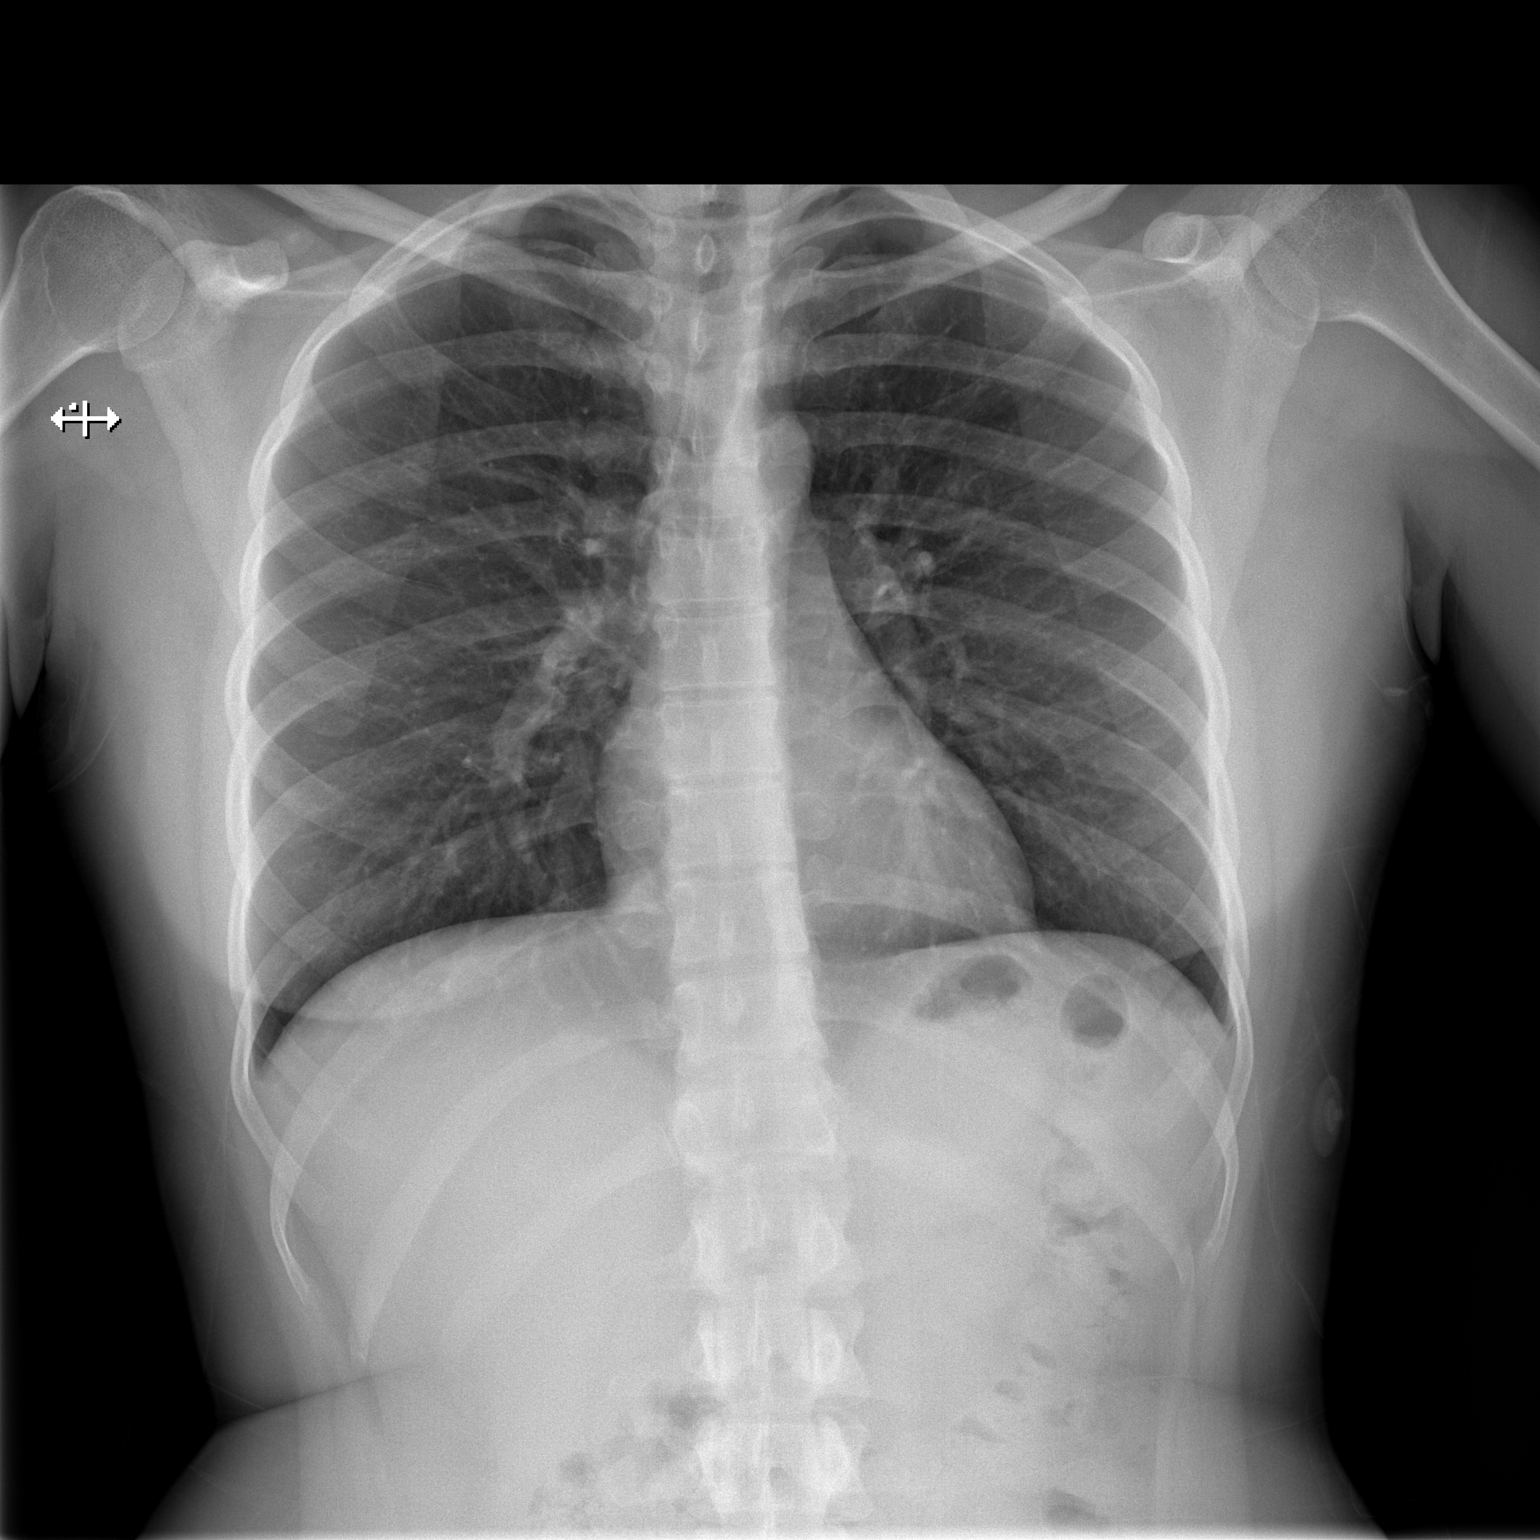

[w chest lat]
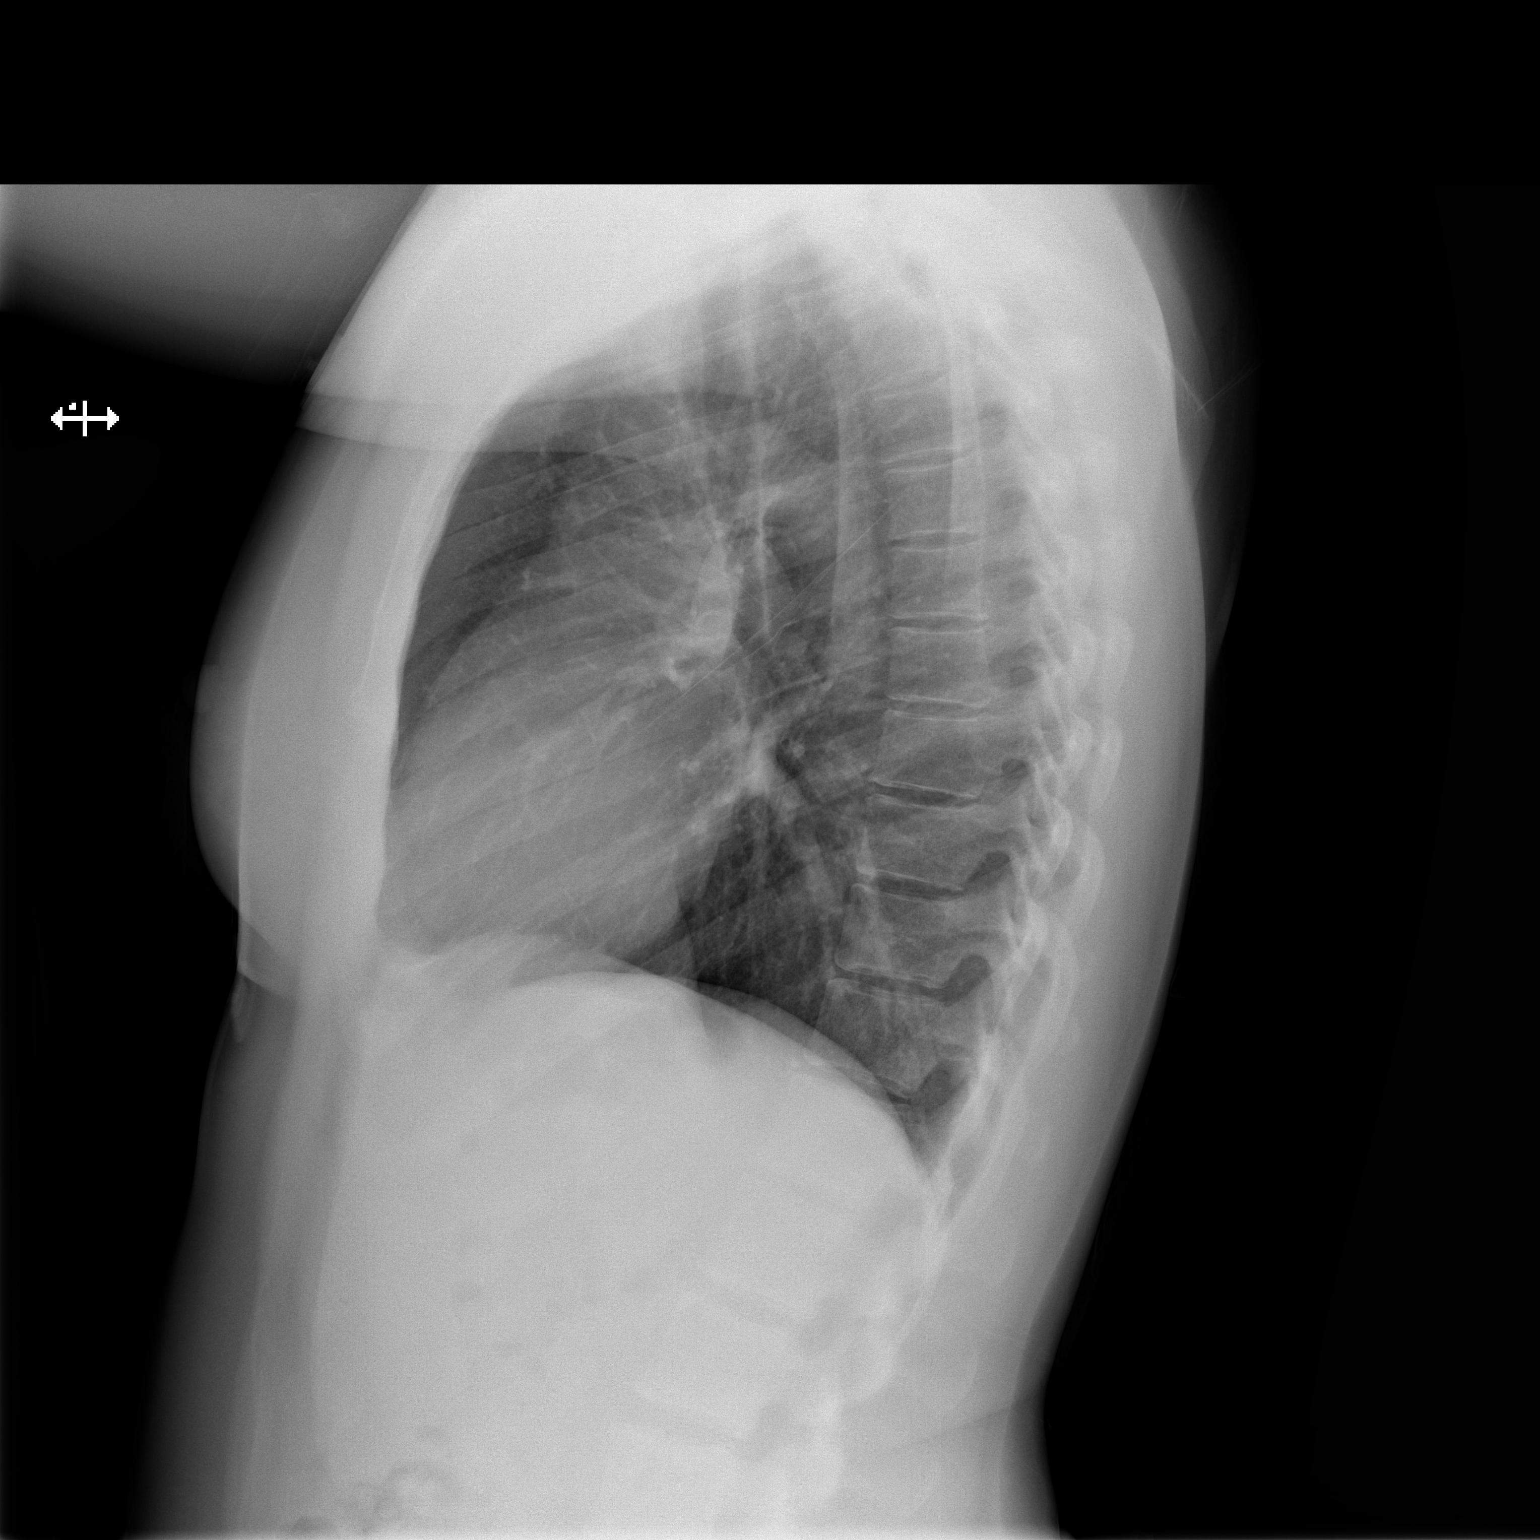

[2 of 2 positions shown; findings below may reference images not displayed]

FINDINGS: The heart size and mediastinal contours are within normal limits.
Both lungs are clear. The visualized skeletal structures are
unremarkable.
IMPRESSION: No active cardiopulmonary disease.

## 2022-11-23 ENCOUNTER — Ambulatory Visit (INDEPENDENT_AMBULATORY_CARE_PROVIDER_SITE_OTHER)
Admission: RE | Admit: 2022-11-23 | Discharge: 2022-11-23 | Disposition: A | Discharge status: Home / Self Care | Payer: Self-pay | Source: Ambulatory Visit | Attending: Physician Assistant | Admitting: Physician Assistant

## 2022-11-23 ENCOUNTER — Ambulatory Visit (INDEPENDENT_AMBULATORY_CARE_PROVIDER_SITE_OTHER): Payer: Self-pay | Admitting: Physician Assistant

## 2022-11-23 ENCOUNTER — Encounter: Payer: Self-pay | Admitting: Physician Assistant

## 2022-11-23 VITALS — BP 126/74 | HR 105 | Temp 97.3°F | Wt 141.8 lb

## 2022-11-23 DIAGNOSIS — M792 Neuralgia and neuritis, unspecified: Secondary | ICD-10-CM

## 2022-11-23 DIAGNOSIS — M79605 Pain in left leg: Secondary | ICD-10-CM

## 2022-11-23 DIAGNOSIS — D72819 Decreased white blood cell count, unspecified: Secondary | ICD-10-CM

## 2022-11-23 DIAGNOSIS — E079 Disorder of thyroid, unspecified: Secondary | ICD-10-CM

## 2022-11-23 DIAGNOSIS — E559 Vitamin D deficiency, unspecified: Secondary | ICD-10-CM

## 2022-11-23 DIAGNOSIS — M79604 Pain in right leg: Secondary | ICD-10-CM

## 2022-11-23 DIAGNOSIS — G8929 Other chronic pain: Secondary | ICD-10-CM

## 2022-11-23 DIAGNOSIS — M5440 Lumbago with sciatica, unspecified side: Secondary | ICD-10-CM

## 2022-11-23 DIAGNOSIS — R790 Abnormal level of blood mineral: Secondary | ICD-10-CM

## 2022-11-23 LAB — COMPREHENSIVE METABOLIC PANEL
ALT: 12 U/L (ref 0–35)
AST: 13 U/L (ref 0–37)
Albumin: 4.1 g/dL (ref 3.5–5.2)
Alkaline Phosphatase: 40 U/L (ref 39–117)
BUN: 16 mg/dL (ref 6–23)
CO2: 27 mEq/L (ref 19–32)
Calcium: 9.1 mg/dL (ref 8.4–10.5)
Chloride: 105 mEq/L (ref 96–112)
Creatinine, Ser: 0.88 mg/dL (ref 0.40–1.20)
GFR: 84.2 mL/min (ref >60.00)
Glucose, Bld: 93 mg/dL (ref 70–99)
Potassium: 4 mEq/L (ref 3.5–5.1)
Sodium: 139 mEq/L (ref 135–145)
Total Bilirubin: 0.5 mg/dL (ref 0.2–1.2)
Total Protein: 7 g/dL (ref 6.0–8.3)

## 2022-11-23 LAB — CBC WITH DIFFERENTIAL/PLATELET
Basophils Absolute: 0 10*3/uL (ref 0.0–0.1)
Basophils Relative: 1 % (ref 0.0–3.0)
Eosinophils Absolute: 0.2 10*3/uL (ref 0.0–0.7)
Eosinophils Relative: 4.6 % (ref 0.0–5.0)
HCT: 35.2 % — ABNORMAL LOW (ref 36.0–46.0)
Hemoglobin: 11.8 g/dL — ABNORMAL LOW (ref 12.0–15.0)
Lymphocytes Relative: 38.7 % (ref 12.0–46.0)
Lymphs Abs: 1.6 10*3/uL (ref 0.7–4.0)
MCHC: 33.6 g/dL (ref 30.0–36.0)
MCV: 90.3 fl (ref 78.0–100.0)
Monocytes Absolute: 0.3 10*3/uL (ref 0.1–1.0)
Monocytes Relative: 7.7 % (ref 3.0–12.0)
Neutro Abs: 1.9 10*3/uL (ref 1.4–7.7)
Neutrophils Relative %: 48 % (ref 43.0–77.0)
Platelets: 290 10*3/uL (ref 150.0–400.0)
RBC: 3.9 Mil/uL (ref 3.87–5.11)
RDW: 13.5 % (ref 11.5–15.5)
WBC: 4 10*3/uL (ref 4.0–10.5)

## 2022-11-23 LAB — T3, FREE: T3, Free: 3.8 pg/mL (ref 2.3–4.2)

## 2022-11-23 LAB — IBC + FERRITIN
Ferritin: 6.1 ng/mL — ABNORMAL LOW (ref 10.0–291.0)
Iron: 61 ug/dL (ref 42–145)
Saturation Ratios: 14.5 % — ABNORMAL LOW (ref 20.0–50.0)
TIBC: 420 ug/dL (ref 250.0–450.0)
Transferrin: 300 mg/dL (ref 212.0–360.0)

## 2022-11-23 LAB — T4, FREE: Free T4: 0.76 ng/dL (ref 0.60–1.60)

## 2022-11-23 LAB — VITAMIN D 25 HYDROXY (VIT D DEFICIENCY, FRACTURES): VITD: 16.28 ng/mL — ABNORMAL LOW (ref 30.00–100.00)

## 2022-11-23 LAB — TSH: TSH: 1.68 u[IU]/mL (ref 0.35–5.50)

## 2022-11-23 NOTE — Progress Notes (Signed)
Subjective:    Patient ID: Michele Arias, female    DOB: [birth year], 37 y.o.   MRN: [id number]  Chief Complaint  Patient presents with   Annual Exam   Lumps     On bilateral upper outer thigh and dent in right buttocks noticed 3 weeks ago    Leg Pain    Bilateral leg pain and swelling for 1 month     HPI  37 year old patient presenting for a few concerns today.  Recently moved back from [place] with her family.  Patient reports that while she was living in [place] for the last year she started to develop some lower back pain and bilateral leg pain.  Feels like sometimes she has some tingling going down the backs of her legs into the bottom of her feet.  Occasionally feels like her legs are going to give out on her.  Denies any injuries or falls.  No skin changes.  No strength changes.  Also reported that she feels like her neck swells sometimes it is occasionally painful.  No swallowing issues.  Denies any bowel movement or urinary changes.  No weight changes. Periods still regular, occasional cramping or clotting. Pap needing to be updated.   Past Medical History:  Diagnosis Date   Anxiety    Depression    Medical history non-contributory    UTI (urinary tract infection)     Past Surgical History:  Procedure Laterality Date   NO PAST SURGERIES      Family History  Problem Relation Age of Onset   Alcohol abuse Mother    Depression Mother    Drug abuse Mother    Hypertension Mother    Mental illness Mother    Bipolar disorder Mother    Alcohol abuse Father    Drug abuse Sister    Mental illness Sister    Alcohol abuse Brother    Depression Brother    Mental illness Brother    Cancer Maternal Grandmother    Hypertension Maternal Grandmother    Stroke Maternal Grandmother    Cancer Paternal Grandmother    Miscarriages / Stillbirths Sister    Alcohol abuse Brother    Drug abuse Brother     Social History   Tobacco Use   Smoking status: Never   Smokeless  tobacco: Never  Vaping Use   Vaping status: Never Used  Substance Use Topics   Alcohol use: Yes    Comment: rarely   Drug use: No     No Known Allergies  Review of Systems NEGATIVE UNLESS OTHERWISE INDICATED IN HPI      Objective:     BP 126/74   Pulse (!) 105   Temp (!) 97.3 F (36.3 C)   Wt 141 lb 12.8 oz (64.3 kg)   LMP 11/22/2022   SpO2 95%   BMI 22.89 kg/m   Wt Readings from Last 3 Encounters:  11/23/22 141 lb 12.8 oz (64.3 kg)  11/19/21 138 lb 9.6 oz (62.9 kg)  11/04/21 139 lb (63 kg)    BP Readings from Last 3 Encounters:  11/23/22 126/74  11/19/21 102/68  11/04/21 115/71     Physical Exam Vitals and nursing note reviewed.  Constitutional:      Appearance: Normal appearance. She is normal weight. She is not toxic-appearing.  HENT:     Head: Normocephalic and atraumatic.     Right Ear: External ear normal.     Left Ear: External ear normal.     Nose:  Nose normal.     Mouth/Throat:     Mouth: Mucous membranes are moist.  Eyes:     Extraocular Movements: Extraocular movements intact.     Conjunctiva/sclera: Conjunctivae normal.     Pupils: Pupils are equal, round, and reactive to light.  Neck:     Thyroid: Thyromegaly present. No thyroid tenderness.  Cardiovascular:     Rate and Rhythm: Normal rate and regular rhythm.     Pulses: Normal pulses.     Heart sounds: Normal heart sounds.  Pulmonary:     Effort: Pulmonary effort is normal.     Breath sounds: Normal breath sounds.  Musculoskeletal:        General: Normal range of motion.     Cervical back: Normal range of motion and neck supple.     Right lower leg: No edema.     Left lower leg: No edema.  Skin:    General: Skin is warm and dry.     Findings: No lesion or rash.  Neurological:     General: No focal deficit present.     Mental Status: She is alert and oriented to person, place, and time.     Motor: No weakness.     Gait: Gait normal.  Psychiatric:        Mood and Affect: Mood  normal.        Behavior: Behavior normal.        Assessment & Plan:   Problem List Items Addressed This Visit   None Visit Diagnoses     Bilateral leg pain    -  Primary   Relevant Orders   DG Lumbar Spine 2-3 Views   Neuropathic pain       Relevant Orders   VITAMIN D 25 Hydroxy (Vit-D Deficiency, Fractures)   TSH   Comprehensive metabolic panel   CBC with Differential/Platelet   T3, free   T4, free   DG Lumbar Spine 2-3 Views   Swelling of thyroid gland       Relevant Orders   TSH   T3, free   T4, free   US THYROID   Low ferritin       Relevant Orders   CBC with Differential/Platelet   IBC + Ferritin   Leukopenia, unspecified type       Relevant Orders   CBC with Differential/Platelet   Vitamin D deficiency       Relevant Orders   VITAMIN D 25 Hydroxy (Vit-D Deficiency, Fractures)   Chronic midline low back pain with sciatica, sciatica laterality unspecified       Relevant Orders   DG Lumbar Spine 2-3 Views      No obvious abnormalities noted on exam or vital signs today.  She is overall well-appearing.  Plan to update labs and treat pending results.  Pain she is describing in her back and legs sounds like it may be neuropathic.  Plan to start with an x-ray of the low back.  May need to consult with sports medicine.  She did have an ultrasound of her abdomen while she was in [place] and this was normal she reports.  Her thyroid does feel somewhat enlarged.  Plan to check thyroid labs and also obtain a thyroid ultrasound.  Treat pending results.  Plan to update her soon for a well woman exam at our office.  Patient is to call or recheck sooner if any questions or concerns.   Time Spent: 33 minutes of total time was spent on  the date of the encounter performing the following actions: chart review prior to seeing the patient, obtaining history, performing a medically necessary exam, counseling on the treatment plan, placing orders, and documenting in our EHR.    This note was prepared with assistance of Conservation officer, historic buildings. Occasional wrong-word or sound-a-like substitutions may have occurred due to the inherent limitations of voice recognition software.    Cutler Sunday M Elleigh Cassetta, PA-C

## 2022-11-23 NOTE — Patient Instructions (Signed)
Good to see you again today!  Labs today - will let you know of results / treat accordingly  Someone will call to schedule thyroid US  For back XRAY:   [facility] [address] [place], [place] [address] Tel: [phone] Hours: M-F 8:30 am - 5:00 pm Closed for lunch 12:30 pm - 1:00 pm

## 2022-11-25 ENCOUNTER — Encounter (INDEPENDENT_AMBULATORY_CARE_PROVIDER_SITE_OTHER): Payer: Self-pay

## 2022-11-27 ENCOUNTER — Other Ambulatory Visit: Payer: Self-pay

## 2022-11-28 ENCOUNTER — Encounter: Payer: Self-pay | Admitting: Physician Assistant

## 2022-11-30 ENCOUNTER — Other Ambulatory Visit: Payer: Self-pay | Admitting: Physician Assistant

## 2022-11-30 DIAGNOSIS — M79604 Pain in right leg: Secondary | ICD-10-CM

## 2022-11-30 DIAGNOSIS — M4306 Spondylolysis, lumbar region: Secondary | ICD-10-CM

## 2022-11-30 MED ORDER — VITAMIN D (ERGOCALCIFEROL) 1.25 MG (50000 UNIT) PO CAPS: 50000.0000 [IU] | ORAL_CAPSULE | ORAL | 2 refills | Status: DC

## 2022-11-30 NOTE — Telephone Encounter (Signed)
Please see message. I have already sent in Vit D.

## 2022-12-02 ENCOUNTER — Ambulatory Visit
Admission: RE | Admit: 2022-12-02 | Discharge: 2022-12-02 | Disposition: A | Discharge status: Home / Self Care | Payer: Self-pay | Source: Ambulatory Visit | Attending: Physician Assistant | Admitting: Physician Assistant

## 2022-12-02 DIAGNOSIS — E079 Disorder of thyroid, unspecified: Secondary | ICD-10-CM

## 2022-12-04 ENCOUNTER — Other Ambulatory Visit: Payer: Self-pay | Admitting: Oncology

## 2022-12-04 DIAGNOSIS — Z006 Encounter for examination for normal comparison and control in clinical research program: Secondary | ICD-10-CM

## 2022-12-08 NOTE — Progress Notes (Unsigned)
    A[facility] [address] [place] [address] Phone: [phone]   Assessment and Plan:     There are no diagnoses linked to this encounter.  ***   Pertinent previous records reviewed include ***   Follow Up: ***     Subjective:   I, Morayma Godown, am serving as a Neurosurgeon for Doctor Richardean Sale  Chief Complaint: back pain   HPI:   12/09/2022 Patient is a 37 year old female complaining of back pain . Patient states  Relevant Historical Information: ***  Additional pertinent review of systems negative.   Current Outpatient Medications:    hydrOXYzine (ATARAX) 25 MG tablet, Take 1 tablet (25 mg total) by mouth every 6 (six) hours as needed for anxiety., Disp: 12 tablet, Rfl: 0   Multiple Vitamins-Minerals (MULTIVITAMIN GUMMIES ADULT) CHEW, Chew by mouth daily. Take 1-2 chewables daily, Disp: , Rfl:    Vitamin D, Ergocalciferol, (DRISDOL) 1.25 MG (50000 UNIT) CAPS capsule, Take 1 capsule (50,000 Units total) by mouth every 7 (seven) days., Disp: 4 capsule, Rfl: 2   Objective:     There were no vitals filed for this visit.    There is no height or weight on file to calculate BMI.    Physical Exam:    ***   Electronically signed by:  Aleen [facility] 4:40 PM 12/08/22

## 2022-12-09 ENCOUNTER — Ambulatory Visit (INDEPENDENT_AMBULATORY_CARE_PROVIDER_SITE_OTHER): Payer: Self-pay | Admitting: Sports Medicine

## 2022-12-09 VITALS — BP 118/72 | HR 114 | Ht 66.0 in | Wt 141.0 lb

## 2022-12-09 DIAGNOSIS — M4306 Spondylolysis, lumbar region: Secondary | ICD-10-CM

## 2022-12-09 DIAGNOSIS — M545 Low back pain, unspecified: Secondary | ICD-10-CM

## 2022-12-09 DIAGNOSIS — G8929 Other chronic pain: Secondary | ICD-10-CM

## 2022-12-09 MED ORDER — MELOXICAM 15 MG PO TABS: 15.0000 mg | ORAL_TABLET | Freq: Every day | ORAL | 0 refills | Status: DC

## 2022-12-09 NOTE — Patient Instructions (Signed)
-   Start meloxicam 15 mg daily x2 weeks.  If still having pain after 2 weeks, complete 3rd-week of meloxicam. May use remaining meloxicam as needed once daily for pain control.  Do not to use additional NSAIDs while taking meloxicam.  May use Tylenol [phone] 2 to 3 times a day for breakthrough pain. Low back HEP  Pt referral  4 week follow up

## 2022-12-09 NOTE — Therapy (Signed)
OUTPATIENT PHYSICAL THERAPY THORACOLUMBAR EVALUATION   Patient Name: Michele Arias MRN: [id number] DOB:1985-10-19, 37 y.o., female Today's Date: 12/10/2022  END OF SESSION:  PT End of Session - 12/10/22 0806     Visit Number 1    Number of Visits 8    Date for PT Re-Evaluation 02/04/23    Authorization Type Self pay    PT Start Time 0808    PT Stop Time 0847    PT Time Calculation (min) 39 min    Activity Tolerance Patient tolerated treatment well    Behavior During Therapy [facility] for tasks assessed/performed             Past Medical History:  Diagnosis Date   Anxiety    Depression    Medical history non-contributory    UTI (urinary tract infection)    Past Surgical History:  Procedure Laterality Date   NO PAST SURGERIES     There are no problems to display for this patient.   PCP: Allwardt, Crist Infante, PA-C   REFERRING PROVIDER: Richardean Sale, DO   REFERRING DIAG: M54.50,G89.29 (ICD-10-CM) - Chronic bilateral low back pain without sciatica   Rationale for Evaluation and Treatment: Rehabilitation  THERAPY DIAG:  Other low back pain  Muscle weakness (generalized)  ONSET DATE: 5 months ago  SUBJECTIVE:                                                                                                                                                                                           SUBJECTIVE STATEMENT: Started having back pain 5 months ago not sure why. Fell 3 months ago when standing on a night stand to reach up in a closet. Hit back on dresser and it made it worse.Sit to stand hurts or supine to sit. Got pain in the mid back yesterday for the first time. Pain shoots into B legs to bottom of her feet. The radicular pain is intermittent. Felt numbness in her L leg yesterday.  PERTINENT HISTORY:  Anxiety, depression, Bilateral pars defects at L5.   PAIN:  Are you having pain? Yes: NPRS scale: 0 up to 7/10 Pain location: low back into B LE   Pain  description: pressure and soreness and feels at abdomen as well Aggravating factors: sit to stand supine to sit, prolonged standing Relieving factors: lying on her stomach  PRECAUTIONS: None  RED FLAGS: None   WEIGHT BEARING RESTRICTIONS: No  FALLS:  Has patient fallen in last 6 months? Yes. 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   PLOF: Independent  PATIENT GOALS: fix my back  NEXT MD VISIT: 6 weeks  OBJECTIVE:   DIAGNOSTIC FINDINGS:  Bilateral pars defects at L5.   PATIENT SURVEYS:  Modified Oswestry  5 / 50 = 10.0 %   COGNITION: Overall cognitive status: Within functional limits for tasks assessed     SENSATION: Not tested  MUSCLE LENGTH: Mild tightness B HS, tight paraspinals  POSTURE: anterior pelvic tilt  PALPATION: unremarkable  LUMBAR ROM: Full pain with left SB   LOWER EXTREMITY ROM:   WNL   LOWER EXTREMITY MMT:  5/5   LUMBAR SPECIAL TESTS:  Quadrant test: Positive   TODAY'S TREATMENT:                                                                                                                              DATE:   12/10/22 See pt ed and HEP   PATIENT EDUCATION:  Education details: anticipated POC, initial HEP, and postural awareness  Person educated: Patient Education method: Explanation, Demonstration, Tactile cues, Verbal cues, and Handouts Education comprehension: verbalized understanding, returned demonstration, and verbal cues required  HOME EXERCISE PROGRAM: Access Code: [email or web address] URL: [email or web address] Date: 12/10/2022 Prepared by: Raynelle Fanning  Exercises - Supine Posterior Pelvic Tilt  - 2 x daily - 7 x weekly - 1-2 sets - 10 reps - 5 sec hold - Supine Transversus Abdominis Bracing with Double Leg Fallout  - 2 x daily - 7 x  weekly - 1-2 sets - 10 reps - Beginner Knee Fold  - 1 x daily - 3-4 x weekly - 1 sets - 10 reps - Plank on Knees  - 1 x daily - 7 x weekly - 1 sets - 5 reps - max hold hold  ASSESSMENT:  CLINICAL IMPRESSION: [name] is a 37 y.o. female who was seen today for physical therapy evaluation and treatment for low back pain starting about 5 months ago and progressively worsening to where now she has intermittent pain into B LE to feet. Imaging is positive for B pars defect at L5 (unknown MOI). Elynor has full Lumbar and B LE ROM and 5/5 LE strength with MMT. She does have increased lumbar lordosis and decreased core strength. She has expected pain with extension. Her pain is increased with sit to stand transfers, supine to sit transfers and prolonged standing. She will benefit from skilled PT to address these deficits.    OBJECTIVE IMPAIRMENTS: decreased strength, impaired flexibility, postural dysfunction, and pain.   ACTIVITY LIMITATIONS: standing, transfers, and bed mobility  PARTICIPATION LIMITATIONS: occupation  PERSONAL FACTORS:  work schedule  are also affecting patient's functional outcome.   REHAB POTENTIAL: Excellent  CLINICAL DECISION MAKING: Stable/uncomplicated  EVALUATION COMPLEXITY: Low   GOALS: Goals reviewed with patient? Yes  SHORT TERM GOALS: Target date: 12/31/22  Ind with HEP Baseline: Goal status: INITIAL  2.  Decreased back and leg  pain by 25% with ADLS Baseline:  Goal status: INITIAL   LONG TERM GOALS: Target date: 02/04/23  Ind with advanced HEP for core strengthening Baseline:  Goal status: INITIAL  2.  No radicular sx reported into B LE Baseline:  Goal status: INITIAL  3.  Able to perform sit to stand and bed mobility without increased pain Baseline:  Goal status: INITIAL  4.  Decreased Low back pain by 75% with ADLs to improve QOL. Baseline:  Goal status: INITIAL  5.  Improved Modified Oswestry to 0/50 showing functional  improvement Baseline:  Goal status: INITIAL   PLAN:  PT FREQUENCY: 1x/week  PT DURATION: 8 weeks  PLANNED INTERVENTIONS: Therapeutic exercises, Therapeutic activity, Neuromuscular re-education, Patient/Family education, Self Care, Joint mobilization, Dry Needling, Electrical stimulation, Spinal mobilization, Cryotherapy, Moist heat, Taping, and Manual therapy.  PLAN FOR NEXT SESSION: focus on transverse abdominus, core and lumbar stab; neutral posturing, go over sit to stand form and sit to supine transfers.   Solon Palm, PT  12/10/2022, 10:49 AM

## 2022-12-10 ENCOUNTER — Ambulatory Visit: Payer: Self-pay | Attending: Sports Medicine | Admitting: Physical Therapy

## 2022-12-10 ENCOUNTER — Encounter: Payer: Self-pay | Admitting: Physical Therapy

## 2022-12-10 ENCOUNTER — Other Ambulatory Visit: Payer: Self-pay

## 2022-12-10 DIAGNOSIS — M6281 Muscle weakness (generalized): Secondary | ICD-10-CM | POA: Insufficient documentation

## 2022-12-10 DIAGNOSIS — M5459 Other low back pain: Secondary | ICD-10-CM | POA: Insufficient documentation

## 2022-12-10 DIAGNOSIS — M545 Low back pain, unspecified: Secondary | ICD-10-CM | POA: Insufficient documentation

## 2022-12-10 DIAGNOSIS — G8929 Other chronic pain: Secondary | ICD-10-CM | POA: Insufficient documentation

## 2022-12-17 ENCOUNTER — Telehealth: Payer: Self-pay | Admitting: Physician Assistant

## 2022-12-17 NOTE — Telephone Encounter (Signed)
Patient dropped off document medical report , to be filled out by provider. Patient requested to send it back via Call Patient to pick up within 5-days. Document is located in providers tray at front office.Please advise at Mobile [phone] (mobile)

## 2022-12-17 NOTE — Telephone Encounter (Signed)
Patient last annual CPE completed 11/19/2021. Patient needs appointment before paperwork can be completed. Please contact patient to schedule an appointment for annual CPE. Thanks

## 2022-12-17 NOTE — Telephone Encounter (Signed)
This is perfect actually, we will complete CPE and WWE same day

## 2022-12-23 ENCOUNTER — Ambulatory Visit: Payer: Self-pay | Admitting: Physician Assistant

## 2023-01-05 ENCOUNTER — Ambulatory Visit: Payer: Self-pay | Attending: Sports Medicine

## 2023-01-05 DIAGNOSIS — M6281 Muscle weakness (generalized): Secondary | ICD-10-CM | POA: Insufficient documentation

## 2023-01-05 DIAGNOSIS — M5459 Other low back pain: Secondary | ICD-10-CM | POA: Insufficient documentation

## 2023-01-05 NOTE — Therapy (Signed)
OUTPATIENT PHYSICAL THERAPY THORACOLUMBAR EVALUATION   Patient Name: Michele Arias MRN: [id number] DOB:Jun 24, 1985, 37 y.o., female Today's Date: 01/05/2023  END OF SESSION:  PT End of Session - 01/05/23 0810     Visit Number 2    Authorization Type Self pay    PT Start Time 0732    PT Stop Time 0801    PT Time Calculation (min) 29 min    Activity Tolerance Patient tolerated treatment well    Behavior During Therapy [facility] for tasks assessed/performed              Past Medical History:  Diagnosis Date   Anxiety    Depression    Medical history non-contributory    UTI (urinary tract infection)    Past Surgical History:  Procedure Laterality Date   NO PAST SURGERIES     There are no problems to display for this patient.   PCP: Allwardt, Crist Infante, PA-C   REFERRING PROVIDER: Richardean Sale, DO   REFERRING DIAG: M54.50,G89.29 (ICD-10-CM) - Chronic bilateral low back pain without sciatica   Rationale for Evaluation and Treatment: Rehabilitation  THERAPY DIAG:  Other low back pain  Muscle weakness (generalized)  ONSET DATE: 5 months ago  SUBJECTIVE:                                                                                                                                                                                           SUBJECTIVE STATEMENT: I'm feeling good today.  I hurt when I extend my back.    PERTINENT HISTORY:  Anxiety, depression, Bilateral pars defects at L5.   PAIN:  Are you having pain? Yes: NPRS scale: 4/10 Pain location: low back into B LE   Pain description: pressure and soreness and feels at abdomen as well Aggravating factors: sit to stand supine to sit, prolonged standing Relieving factors: lying on her stomach  PRECAUTIONS: None  RED FLAGS: None   WEIGHT BEARING RESTRICTIONS: No  FALLS:  Has patient fallen in last 6 months? Yes. 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   PLOF: Independent  PATIENT GOALS: fix my back  NEXT MD VISIT: 6 weeks  OBJECTIVE:   DIAGNOSTIC FINDINGS:  Bilateral pars defects at L5.   PATIENT SURVEYS:  Modified Oswestry  5 / 50 = 10.0 %   COGNITION: Overall cognitive status: Within functional limits for tasks assessed     SENSATION: Not tested  MUSCLE LENGTH: Mild tightness B  HS, tight paraspinals  POSTURE: anterior pelvic tilt  PALPATION: unremarkable  LUMBAR ROM: Full pain with left SB   LOWER EXTREMITY ROM:   WNL   LOWER EXTREMITY MMT:  5/5   LUMBAR SPECIAL TESTS:  Quadrant test: Positive   TODAY'S TREATMENT:    DATE:   01/05/23  Seated hamstring stretch and figure 4 stretch   Pelvic tilt 5" x15 TA activation with knee folds x10 Rt and Lt Plank on knees x5 TA activation with leg fall-outs Instructed pt to use TA activation with transitional movements to reduce pain Discussed DN and issued handout with info- she will consider next session                                                                                                                      DATE:   12/10/22 See pt ed and HEP   PATIENT EDUCATION:  Education details: anticipated POC, initial HEP, and postural awareness, Access Code: [email or web address]  Person educated: Patient Education method: Explanation, Demonstration, Tactile cues, Verbal cues, and Handouts Education comprehension: verbalized understanding, returned demonstration, and verbal cues required  HOME EXERCISE PROGRAM: Access Code: [email or web address] URL: [email or web address] Date: 01/05/2023 Prepared by: Tresa Endo  Exercises - Supine Posterior Pelvic Tilt  - 2 x daily - 7 x weekly - 1-2 sets - 10 reps - 5 sec hold - Supine Transversus Abdominis Bracing with Double Leg Fallout  - 2 x daily - 7 x weekly - 1-2 sets - 10 reps - Beginner  Knee Fold  - 1 x daily - 3-4 x weekly - 1 sets - 10 reps - Plank on Knees  - 1 x daily - 7 x weekly - 1 sets - 5 reps - max hold hold - Seated Hamstring Stretch  - 2 x daily - 7 x weekly - 1 sets - 3 reps - 20 hold - Seated Figure 4 Piriformis Stretch  - 2 x daily - 7 x weekly - 1 sets - 3 reps - 20 hold - Supine Piriformis Stretch with Leg Straight  - 2 x daily - 7 x weekly - 1 sets - 3 reps - 20 hold  ASSESSMENT:  CLINICAL IMPRESSION: First time follow-up after evaluation.  Session spent reviewing HEP and adding to HEP for flexibility.  Pt demonstrated good TA activation and control with added leg motions. PT educated pt regarding DN and she will consider for next session  She will benefit from skilled PT to address these deficits.    OBJECTIVE IMPAIRMENTS: decreased strength, impaired flexibility, postural dysfunction, and pain.   ACTIVITY LIMITATIONS: standing, transfers, and bed mobility  PARTICIPATION LIMITATIONS: occupation  PERSONAL FACTORS:  work schedule  are also affecting patient's functional outcome.   REHAB POTENTIAL: Excellent  CLINICAL DECISION MAKING: Stable/uncomplicated  EVALUATION COMPLEXITY: Low   GOALS: Goals reviewed with patient? Yes  SHORT TERM GOALS: Target date: 12/31/22  Ind with HEP Baseline: Goal status: In progress   2.  Decreased back and leg pain by 25% with  ADLS Baseline:  Goal status: INITIAL   LONG TERM GOALS: Target date: 02/04/23  Ind with advanced HEP for core strengthening Baseline:  Goal status: INITIAL  2.  No radicular sx reported into B LE Baseline:  Goal status: INITIAL  3.  Able to perform sit to stand and bed mobility without increased pain Baseline:  Goal status: INITIAL  4.  Decreased Low back pain by 75% with ADLs to improve QOL. Baseline:  Goal status: INITIAL  5.  Improved Modified Oswestry to 0/50 showing functional improvement Baseline:  Goal status: INITIAL   PLAN:  PT FREQUENCY: 1x/week  PT  DURATION: 8 weeks  PLANNED INTERVENTIONS: Therapeutic exercises, Therapeutic activity, Neuromuscular re-education, Patient/Family education, Self Care, Joint mobilization, Dry Needling, Electrical stimulation, Spinal mobilization, Cryotherapy, Moist heat, Taping, and Manual therapy.  PLAN FOR NEXT SESSION: DN if pt agrees, review new HEP   Lorrene Reid, PT 01/05/23 8:12 AM

## 2023-01-12 ENCOUNTER — Ambulatory Visit: Payer: Self-pay | Admitting: Physician Assistant

## 2023-01-12 ENCOUNTER — Ambulatory Visit: Payer: Self-pay

## 2023-01-13 ENCOUNTER — Other Ambulatory Visit: Payer: Self-pay | Admitting: Sports Medicine

## 2023-01-19 ENCOUNTER — Ambulatory Visit: Payer: Self-pay

## 2023-01-19 DIAGNOSIS — M6281 Muscle weakness (generalized): Secondary | ICD-10-CM

## 2023-01-19 DIAGNOSIS — M5459 Other low back pain: Secondary | ICD-10-CM

## 2023-01-19 NOTE — Therapy (Signed)
OUTPATIENT PHYSICAL THERAPY THORACOLUMBAR EVALUATION   Patient Name: Michele Arias MRN: [id number] DOB:1985-11-04, 37 y.o., female Today's Date: 01/19/2023  END OF SESSION:  PT End of Session - 01/19/23 0801     Visit Number 3    Date for PT Re-Evaluation 02/04/23    Authorization Type Self pay    PT Start Time 0741   late   PT Stop Time 0802    PT Time Calculation (min) 21 min    Activity Tolerance Patient tolerated treatment well    Behavior During Therapy [facility] for tasks assessed/performed               Past Medical History:  Diagnosis Date   Anxiety    Depression    Medical history non-contributory    UTI (urinary tract infection)    Past Surgical History:  Procedure Laterality Date   NO PAST SURGERIES     There are no problems to display for this patient.   PCP: Allwardt, Crist Infante, PA-C   REFERRING PROVIDER: Richardean Sale, DO   REFERRING DIAG: M54.50,G89.29 (ICD-10-CM) - Chronic bilateral low back pain without sciatica   Rationale for Evaluation and Treatment: Rehabilitation  THERAPY DIAG:  Other low back pain  Muscle weakness (generalized)  ONSET DATE: 5 months ago  SUBJECTIVE:                                                                                                                                                                                           SUBJECTIVE STATEMENT: 40% overall improvement in symptoms since the start of care.     PERTINENT HISTORY:  Anxiety, depression, Bilateral pars defects at L5.   PAIN:  Are you having pain? Yes: NPRS scale: 3/10 Pain location: low back into B LE   Pain description: pressure and soreness and feels at abdomen as well Aggravating factors: sit to stand supine to sit, prolonged standing Relieving factors: lying on her stomach  PRECAUTIONS: None  RED FLAGS: None   WEIGHT BEARING RESTRICTIONS: No  FALLS:  Has patient fallen in last 6 months? Yes. 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   PLOF: Independent  PATIENT GOALS: fix my back  NEXT MD VISIT: 6 weeks  OBJECTIVE:   DIAGNOSTIC FINDINGS:  Bilateral pars defects at L5.   PATIENT SURVEYS:  Modified Oswestry  5 / 50 = 10.0 %   COGNITION: Overall cognitive status: Within functional limits for tasks assessed  SENSATION: Not tested  MUSCLE LENGTH: Mild tightness B HS, tight paraspinals  POSTURE: anterior pelvic tilt  PALPATION: unremarkable  LUMBAR ROM: Full pain with left SB   LOWER EXTREMITY ROM:   WNL   LOWER EXTREMITY MMT:  5/5   LUMBAR SPECIAL TESTS:  Quadrant test: Positive   TODAY'S TREATMENT:    DATE:   01/19/23  Seated hamstring stretch and figure 4 stretch Open book stretch Trigger Point Dry-Needling  Treatment instructions: Expect mild to moderate muscle soreness. S/S of pneumothorax if dry needled over a lung field, and to seek immediate medical attention should they occur. Patient verbalized understanding of these instructions and education.  Patient Consent Given: Yes Education handout provided: No Muscles treated: bil lumbar multifidi Treatment response/outcome: Utilized skilled palpation to identify trigger points.  During dry needling able to palpate muscle twitch and muscle elongation   Skilled palpation and monitoring by PT during dry needling  01/05/23  Seated hamstring stretch and figure 4 stretch   Pelvic tilt 5" x15 TA activation with knee folds x10 Rt and Lt Plank on knees x5 TA activation with leg fall-outs Instructed pt to use TA activation with transitional movements to reduce pain Discussed DN and issued handout with info- she will consider next session                                                                                                                       DATE:   12/10/22 See pt ed and HEP   PATIENT EDUCATION:  Education details: anticipated POC, initial HEP, and postural awareness, Access Code: [email or web address]  Person educated: Patient Education method: Explanation, Demonstration, Tactile cues, Verbal cues, and Handouts Education comprehension: verbalized understanding, returned demonstration, and verbal cues required  HOME EXERCISE PROGRAM: Access Code: [email or web address] URL: [email or web address] Date: 01/19/2023 Prepared by: Tresa Endo  Exercises - Supine Posterior Pelvic Tilt  - 2 x daily - 7 x weekly - 1-2 sets - 10 reps - 5 sec hold - Supine Transversus Abdominis Bracing with Double Leg Fallout  - 2 x daily - 7 x weekly - 1-2 sets - 10 reps - Beginner Knee Fold  - 1 x daily - 3-4 x weekly - 1 sets - 10 reps - Plank on Knees  - 1 x daily - 7 x weekly - 1 sets - 5 reps - max hold hold - Seated Hamstring Stretch  - 2 x daily - 7 x weekly - 1 sets - 3 reps - 20 hold - Seated Figure 4 Piriformis Stretch  - 2 x daily - 7 x weekly - 1 sets - 3 reps - 20 hold - Supine Piriformis Stretch with Leg Straight  - 2 x daily - 7 x weekly - 1 sets - 3 reps - 20 hold - Sidelying Open Book Thoracic Lumbar Rotation and Extension  - 2 x daily - 7 x weekly - 1 sets - 10 reps  ASSESSMENT:  CLINICAL IMPRESSION: Pt reports 40% overall improvement in symptoms since the  start of care.  Pt reports intermittent compliance with HEP.  She was agreeable to DN today and had good response with improved tissue mobility after.   PT added open book stretch and emphasized importance of stretching to maximize benefit from treatment.  She will benefit from skilled PT to address these deficits.    OBJECTIVE IMPAIRMENTS: decreased strength, impaired flexibility, postural dysfunction, and pain.   ACTIVITY LIMITATIONS: standing, transfers, and bed mobility  PARTICIPATION LIMITATIONS: occupation  PERSONAL FACTORS:  work schedule  are also affecting patient's  functional outcome.   REHAB POTENTIAL: Excellent  CLINICAL DECISION MAKING: Stable/uncomplicated  EVALUATION COMPLEXITY: Low   GOALS: Goals reviewed with patient? Yes  SHORT TERM GOALS: Target date: 12/31/22  Ind with HEP Baseline: Goal status: In progress   2.  Decreased back and leg pain by 25% with ADLS Baseline:  Goal status: INITIAL   LONG TERM GOALS: Target date: 02/04/23  Ind with advanced HEP for core strengthening Baseline:  Goal status: INITIAL  2.  No radicular sx reported into B LE Baseline:  Goal status: INITIAL  3.  Able to perform sit to stand and bed mobility without increased pain Baseline:  Goal status: INITIAL  4.  Decreased Low back pain by 75% with ADLs to improve QOL. Baseline:  Goal status: INITIAL  5.  Improved Modified Oswestry to 0/50 showing functional improvement Baseline:  Goal status: INITIAL   PLAN:  PT FREQUENCY: 1x/week  PT DURATION: 8 weeks  PLANNED INTERVENTIONS: Therapeutic exercises, Therapeutic activity, Neuromuscular re-education, Patient/Family education, Self Care, Joint mobilization, Dry Needling, Electrical stimulation, Spinal mobilization, Cryotherapy, Moist heat, Taping, and Manual therapy.  PLAN FOR NEXT SESSION: assess response to DN, repeat if helpful, add to HEP   Lorrene Reid, PT 01/19/23 8:02 AM

## 2023-01-20 NOTE — Progress Notes (Unsigned)
    [facility] [address] [place] [address] Phone: [phone]   Assessment and Plan:     There are no diagnoses linked to this encounter.  ***   Pertinent previous records reviewed include ***   Follow Up: ***     Subjective:   I, Zyniah Ferraiolo, am serving as a Neurosurgeon for Doctor Richardean Sale   Chief Complaint: back pain    HPI:    12/09/2022 Patient is a 37 year old female complaining of back pain . Patient states that she fell off a dresser that was unsteady she hit her back and her head and the pain has increased since this fall was 5 months ago. No meds for the pain. Locates pain to low back. Pain does radiate down the legs. Does endorses some left leg numbness. Yesterday was the first day she had thoracic back pain    01/21/2023 Patient states   Relevant Historical Information: None pertinent    Additional pertinent review of systems negative.   Current Outpatient Medications:    hydrOXYzine (ATARAX) 25 MG tablet, Take 1 tablet (25 mg total) by mouth every 6 (six) hours as needed for anxiety., Disp: 12 tablet, Rfl: 0   meloxicam (MOBIC) 15 MG tablet, Take 1 tablet (15 mg total) by mouth daily., Disp: 30 tablet, Rfl: 0   Multiple Vitamins-Minerals (MULTIVITAMIN GUMMIES ADULT) CHEW, Chew by mouth daily. Take 1-2 chewables daily, Disp: , Rfl:    Vitamin D, Ergocalciferol, (DRISDOL) 1.25 MG (50000 UNIT) CAPS capsule, Take 1 capsule (50,000 Units total) by mouth every 7 (seven) days., Disp: 4 capsule, Rfl: 2   Objective:     There were no vitals filed for this visit.    There is no height or weight on file to calculate BMI.    Physical Exam:    ***   Electronically signed by:  Aleen [facility]ells [facility] 7:51 AM 01/20/23

## 2023-01-21 ENCOUNTER — Ambulatory Visit: Payer: Self-pay | Admitting: Sports Medicine

## 2023-01-21 VITALS — BP 118/84 | HR 105 | Ht 66.0 in | Wt 140.0 lb

## 2023-01-21 DIAGNOSIS — M4306 Spondylolysis, lumbar region: Secondary | ICD-10-CM

## 2023-01-21 DIAGNOSIS — G8929 Other chronic pain: Secondary | ICD-10-CM

## 2023-01-21 DIAGNOSIS — M545 Low back pain, unspecified: Secondary | ICD-10-CM

## 2023-01-21 NOTE — Patient Instructions (Signed)
-   Start meloxicam 15 mg daily x2 weeks.  If still having pain after 2 weeks, complete 3rd-week of meloxicam. May use remaining meloxicam as needed once daily for pain control.  Do not to use additional NSAIDs while taking meloxicam.  May use Tylenol [phone] mg 2 to 3 times a day for breakthrough pain. Recommend putting meloxicam by your toothbrush to remind yourself to take it everyday  Continue HEP and PT  Avoid heavy lifting ,lunges ,and squats 6 week follow up

## 2023-01-28 ENCOUNTER — Ambulatory Visit: Payer: 59

## 2023-02-02 ENCOUNTER — Ambulatory Visit: Payer: 59 | Attending: Sports Medicine

## 2023-02-02 DIAGNOSIS — M6281 Muscle weakness (generalized): Secondary | ICD-10-CM | POA: Diagnosis not present

## 2023-02-02 DIAGNOSIS — R252 Cramp and spasm: Secondary | ICD-10-CM | POA: Diagnosis not present

## 2023-02-02 DIAGNOSIS — M5459 Other low back pain: Secondary | ICD-10-CM | POA: Diagnosis not present

## 2023-02-02 NOTE — Therapy (Signed)
OUTPATIENT PHYSICAL THERAPY THORACOLUMBAR EVALUATION   Patient Name: Michele Arias MRN: [id number] DOB:Feb 25, 1986, 37 y.o., female Today's Date: 02/02/2023  END OF SESSION:  PT End of Session - 02/02/23 0809     Visit Number 4    Date for PT Re-Evaluation 03/02/23    Authorization Type Aet[employer]    PT Start Time 0732    PT Stop Time 0806    PT Time Calculation (min) 34 min    Activity Tolerance Patient tolerated treatment well    Behavior During Therapy [facility] for tasks assessed/performed                Past Medical History:  Diagnosis Date   Anxiety    Depression    Medical history non-contributory    UTI (urinary tract infection)    Past Surgical History:  Procedure Laterality Date   NO PAST SURGERIES     There are no problems to display for this patient.   PCP: Allwardt, Crist Infante, PA-C   REFERRING PROVIDER: Richardean Sale, DO   REFERRING DIAG: M54.50,G89.29 (ICD-10-CM) - Chronic bilateral low back pain without sciatica   Rationale for Evaluation and Treatment: Rehabilitation  THERAPY DIAG:  Other low back pain - Plan: PT plan of care cert/re-cert  Muscle weakness (generalized) - Plan: PT plan of care cert/re-cert  Cramp and spasm - Plan: PT plan of care cert/re-cert  ONSET DATE: 5 months ago  SUBJECTIVE:                                                                                                                                                                                           SUBJECTIVE STATEMENT: I am better overall.  I feel 80% better since the start of care.  I have pain if I sit or stand for too long.  Max pain is 3/10.   PERTINENT HISTORY:  Anxiety, depression, Bilateral pars defects at L5.   PAIN:  Are you having pain? Yes: NPRS scale: 3/10 Pain location: low back into B LE   Pain description: pressure and soreness and feels at abdomen as well Aggravating factors: sit to stand supine to sit, prolonged standing Relieving  factors: lying on her stomach  PRECAUTIONS: None  RED FLAGS: None   WEIGHT BEARING RESTRICTIONS: No  FALLS:  Has patient fallen in last 6 months? Yes. 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   PLOF: Independent  PATIENT GOALS: fix my back  NEXT MD VISIT: 6  weeks  OBJECTIVE:   DIAGNOSTIC FINDINGS:  Bilateral pars defects at L5.   PATIENT SURVEYS:  Modified Oswestry  5 / 50 = 10.0 %   COGNITION: Overall cognitive status: Within functional limits for tasks assessed     SENSATION: Not tested  MUSCLE LENGTH: Mild tightness B HS, tight paraspinals  POSTURE: anterior pelvic tilt  PALPATION: unremarkable  LUMBAR ROM: Full pain with left SB   LOWER EXTREMITY ROM:   WNL   LOWER EXTREMITY MMT:  5/5   LUMBAR SPECIAL TESTS:  Quadrant test: Positive   TODAY'S TREATMENT:    DATE:   02/02/23  Seated hamstring stretch and figure 4 stretch Sit to stand with 5# kettle bell 2x10 with TA and gluteal activation Seated on ball: horizontal abduction and ER with red band x20  Supine TA activation: ball squeeze 5" hold x 10 Bridge with ball squeeze x10 Open book x 10 bil   01/19/23  Seated hamstring stretch and figure 4 stretch Open book stretch Trigger Point Dry-Needling  Treatment instructions: Expect mild to moderate muscle soreness. S/S of pneumothorax if dry needled over a lung field, and to seek immediate medical attention should they occur. Patient verbalized understanding of these instructions and education.  Patient Consent Given: Yes Education handout provided: No Muscles treated: bil lumbar multifidi Treatment response/outcome: Utilized skilled palpation to identify trigger points.  During dry needling able to palpate muscle twitch and muscle elongation   Skilled palpation  and monitoring by PT during dry needling  01/05/23  Seated hamstring stretch and figure 4 stretch   Pelvic tilt 5" x15 TA activation with knee folds x10 Rt and Lt Plank on knees x5 TA activation with leg fall-outs Instructed pt to use TA activation with transitional movements to reduce pain Discussed DN and issued handout with info- she will consider next session                                                                                                                       PATIENT EDUCATION:  Education details: anticipated POC, initial HEP, and postural awareness, Access Code: [email or web address]  Person educated: Patient Education method: Explanation, Demonstration, Tactile cues, Verbal cues, and Handouts Education comprehension: verbalized understanding, returned demonstration, and verbal cues required  HOME EXERCISE PROGRAM: Access Code: [email or web address] URL: [email or web address] Date: 01/19/2023 Prepared by: Tresa Endo  Exercises - Supine Posterior Pelvic Tilt  - 2 x daily - 7 x weekly - 1-2 sets - 10 reps - 5 sec hold - Supine Transversus Abdominis Bracing with Double Leg Fallout  - 2 x daily - 7 x weekly - 1-2 sets - 10 reps - Beginner Knee Fold  - 1 x daily - 3-4 x weekly - 1 sets - 10 reps - Plank on Knees  - 1 x daily - 7 x weekly - 1 sets - 5 reps - max hold hold - Seated Hamstring Stretch  - 2 x daily - 7 x weekly - 1 sets - 3 reps - 20  hold - Seated Figure 4 Piriformis Stretch  - 2 x daily - 7 x weekly - 1 sets - 3 reps - 20 hold - Supine Piriformis Stretch with Leg Straight  - 2 x daily - 7 x weekly - 1 sets - 3 reps - 20 hold - Sidelying Open Book Thoracic Lumbar Rotation and Extension  - 2 x daily - 7 x weekly - 1 sets - 10 reps  ASSESSMENT:  CLINICAL IMPRESSION: Pt reports 80% overall improvement in symptoms since the start of care.  Pt reports that she is doing strength exercises a few days a week and stretching daily.   Pt had good response to DN with improved  mobility in the lumbar spine reported after last session. Session today focused on flexibility and core stabilization exercises.  PT monitored for technique and pain throughout session.  She will benefit from skilled PT to address these deficits.    OBJECTIVE IMPAIRMENTS: decreased strength, impaired flexibility, postural dysfunction, and pain.   ACTIVITY LIMITATIONS: standing, transfers, and bed mobility  PARTICIPATION LIMITATIONS: occupation  PERSONAL FACTORS:  work schedule  are also affecting patient's functional outcome.   REHAB POTENTIAL: Excellent  CLINICAL DECISION MAKING: Stable/uncomplicated  EVALUATION COMPLEXITY: Low   GOALS: Goals reviewed with patient? Yes  SHORT TERM GOALS: Target date: 12/31/22  Ind with HEP Baseline: Goal status:MET  2.  Decreased back and leg pain by 25% with ADLS Baseline: 80% (02/02/23) Goal status:MET   LONG TERM GOALS: Target date: 02/04/23  Ind with advanced HEP for core strengthening Baseline:  Goal status: INITIAL  2.  No radicular sx reported into B LE Baseline: very intermittent now (02/02/23) Goal status: INITIAL  3.  Able to perform sit to stand and bed mobility without increased pain Baseline:  Goal status: INITIAL  4.  Decreased Low back pain by 75% with ADLs to improve QOL. Baseline: 80% overall reduction (02/02/23) Goal status: INITIAL  5.  Improved Modified Oswestry to 0/50 showing functional improvement Baseline:  Goal status: INITIAL   PLAN:  PT FREQUENCY: 1x/week  PT DURATION: 4 weeks  PLANNED INTERVENTIONS: Therapeutic exercises, Therapeutic activity, Neuromuscular re-education, Patient/Family education, Self Care, Joint mobilization, Dry Needling, Electrical stimulation, Spinal mobilization, Cryotherapy, Moist heat, Taping, and Manual therapy.  PLAN FOR NEXT SESSION: continue strength and flexibility progression   Lorrene Reid, PT 02/02/23 8:12 AM

## 2023-02-03 ENCOUNTER — Encounter: Payer: Self-pay | Admitting: Physician Assistant

## 2023-02-03 ENCOUNTER — Ambulatory Visit (INDEPENDENT_AMBULATORY_CARE_PROVIDER_SITE_OTHER): Payer: 59 | Admitting: Physician Assistant

## 2023-02-03 ENCOUNTER — Ambulatory Visit (INDEPENDENT_AMBULATORY_CARE_PROVIDER_SITE_OTHER)
Admission: RE | Admit: 2023-02-03 | Discharge: 2023-02-03 | Disposition: A | Discharge status: Home / Self Care | Payer: 59 | Source: Ambulatory Visit | Attending: Physician Assistant

## 2023-02-03 VITALS — BP 104/62 | HR 96 | Temp 97.3°F | Ht 66.0 in | Wt 138.8 lb

## 2023-02-03 DIAGNOSIS — R1084 Generalized abdominal pain: Secondary | ICD-10-CM | POA: Diagnosis not present

## 2023-02-03 NOTE — Progress Notes (Signed)
Subjective:    Patient ID: Michele Arias, female    DOB: 20-Jan-1986, 37 y.o.   MRN: [id number]  Chief Complaint  Patient presents with   Abdominal Pain    Pt c/o pain after eating for the past two weeks; also states it comes and goes even when not eating but mostly when eating. Pt states she is having BM and no issues with going to the bathroom.     Abdominal Pain   Discussed the use of AI scribe software for clinical note transcription with the patient, who gave verbal consent to proceed.  History of Present Illness   The patient, with a history of abdominal pain, presents with a two-week history of sharp, postprandial abdominal pain. The pain is primarily located in the epigastric region and occasionally radiates to the sides. The patient describes the pain as a twisting sensation. The patient reports a similar episode of abdominal pain while in [place], which resolved spontaneously. The patient has been avoiding eating due to the pain, which has not been associated with any specific foods. The patient denies any new supplements or medications, except for Ashwaghanda gummies, as use of a night-time anxiety medication. The patient reports normal bowel movements, but admits to sometimes having small, pellet-like stools. The patient denies any urinary symptoms, blood in the stool, rapid weight loss, or menstrual-related symptoms. The patient has recently returned to work but denies any significant stressors or triggers.       Past Medical History:  Diagnosis Date   Anxiety    Depression    Medical history non-contributory    UTI (urinary tract infection)     Past Surgical History:  Procedure Laterality Date   NO PAST SURGERIES      Family History  Problem Relation Age of Onset   Alcohol abuse Mother    Depression Mother    Drug abuse Mother    Hypertension Mother    Mental illness Mother    Bipolar disorder Mother    Alcohol abuse Father    Drug abuse Sister    Mental  illness Sister    Alcohol abuse Brother    Depression Brother    Mental illness Brother    Cancer Maternal Grandmother    Hypertension Maternal Grandmother    Stroke Maternal Grandmother    Cancer Paternal Grandmother    Miscarriages / Stillbirths Sister    Alcohol abuse Brother    Drug abuse Brother     Social History   Tobacco Use   Smoking status: Never   Smokeless tobacco: Never  Vaping Use   Vaping status: Never Used  Substance Use Topics   Alcohol use: Yes    Comment: rarely   Drug use: No     No Known Allergies  Review of Systems  Gastrointestinal:  Positive for abdominal pain.   NEGATIVE UNLESS OTHERWISE INDICATED IN HPI      Objective:     BP 104/62 (BP Location: Left Arm)   Pulse 96   Temp (!) 97.3 F (36.3 C) (Temporal)   Ht 5\' 6"  (1.676 m)   Wt 138 lb 12.8 oz (63 kg)   LMP 01/15/2023 (Exact Date)   SpO2 98%   BMI 22.40 kg/m   Wt Readings from Last 3 Encounters:  02/03/23 138 lb 12.8 oz (63 kg)  01/21/23 140 lb (63.5 kg)  12/09/22 141 lb (64 kg)    BP Readings from Last 3 Encounters:  02/03/23 104/62  01/21/23 118/84  12/09/22 118/72  Physical Exam Vitals and nursing note reviewed.  Constitutional:      Appearance: She is well-developed.  Cardiovascular:     Rate and Rhythm: Normal rate and regular rhythm.     Heart sounds: Normal heart sounds. No murmur heard. Pulmonary:     Effort: Pulmonary effort is normal.     Breath sounds: Normal breath sounds.  Abdominal:     Tenderness: There is abdominal tenderness in the epigastric area and suprapubic area. There is no right CVA tenderness, left CVA tenderness, guarding or rebound. Negative signs include Murphy's sign, McBurney's sign and psoas sign.     Hernia: No hernia is present.  Skin:    General: Skin is warm.     Findings: No rash.  Neurological:     General: No focal deficit present.     Mental Status: She is alert.  Psychiatric:        Mood and Affect: Mood normal.         Assessment & Plan:  Generalized abdominal pain -     DG Abd 2 Views; Future   Assessment and Plan    Abdominal Pain Epigastric and lateral abdominal pain, described as sharp and twisting, occurring predominantly postprandially. Pain has been present for two weeks, with a similar episode occurring a year ago. Previous workup last year in [place] including blood tests and ultrasound were unremarkable. Differential diagnosis includes peptic ulcer disease, constipation, and gallbladder disease. -Order abdominal x-ray to assess for possible chronic constipation. -Consider further imaging such as CT scan if x-ray is unremarkable and symptoms persist. -Encourage increased fiber intake.  Upcoming OB/GYN appointment on 02/22/2023 for lower abdominal pain. -Encourage patient to attend scheduled appointment.           Return if symptoms worsen or fail to improve.    Teri Diltz M Aleksandra Raben, PA-C

## 2023-02-04 ENCOUNTER — Encounter: Payer: Self-pay | Admitting: Physician Assistant

## 2023-02-04 NOTE — Telephone Encounter (Signed)
Please advise 

## 2023-02-09 ENCOUNTER — Ambulatory Visit: Payer: 59

## 2023-02-09 DIAGNOSIS — M6281 Muscle weakness (generalized): Secondary | ICD-10-CM | POA: Diagnosis not present

## 2023-02-09 DIAGNOSIS — R252 Cramp and spasm: Secondary | ICD-10-CM

## 2023-02-09 DIAGNOSIS — M5459 Other low back pain: Secondary | ICD-10-CM | POA: Diagnosis not present

## 2023-02-09 NOTE — Therapy (Addendum)
OUTPATIENT PHYSICAL THERAPY THORACOLUMBAR EVALUATION   Patient Name: Michele Arias MRN: [id number] DOB:03-Apr-1986, 37 y.o., female Today's Date: 02/09/2023  END OF SESSION:  PT End of Session - 02/09/23 0802     Visit Number 5    Date for PT Re-Evaluation 03/02/23    Authorization Type Aetna    PT Start Time [id number]   late   PT Stop Time 0801    PT Time Calculation (min) 22 min    Activity Tolerance Patient tolerated treatment well    Behavior During Therapy [facility] for tasks assessed/performed                 Past Medical History:  Diagnosis Date   Anxiety    Depression    Medical history non-contributory    UTI (urinary tract infection)    Past Surgical History:  Procedure Laterality Date   NO PAST SURGERIES     Patient Active Problem List   Diagnosis Date Noted   Anxiety 02/25/2021    PCP: Allwardt, Crist Infante, PA-C   REFERRING PROVIDER: Richardean Sale, DO   REFERRING DIAG: M54.50,G89.29 (ICD-10-CM) - Chronic bilateral low back pain without sciatica   Rationale for Evaluation and Treatment: Rehabilitation  THERAPY DIAG:  Other low back pain  Muscle weakness (generalized)  Cramp and spasm  ONSET DATE: 5 months ago  SUBJECTIVE:                                                                                                                                                                                           SUBJECTIVE STATEMENT: I sat on the floor x 2 hours on Sunday night.  On Monday, I had increased pain in my low back after this.    PERTINENT HISTORY:  Anxiety, depression, Bilateral pars defects at L5.   PAIN:  Are you having pain? Yes: NPRS scale: 0-5/10 Pain location: low back into B LE   Pain description: pressure and soreness and feels at abdomen as well Aggravating factors: sit to stand supine to sit, prolonged standing Relieving factors: lying on her stomach  PRECAUTIONS: None  RED FLAGS: None   WEIGHT BEARING RESTRICTIONS:  No  FALLS:  Has patient fallen in last 6 months? Yes. 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   PLOF: Independent  PATIENT GOALS: fix my back  NEXT MD VISIT: 6 weeks  OBJECTIVE:   DIAGNOSTIC FINDINGS:  Bilateral pars defects at L5.   PATIENT SURVEYS:  Modified  Oswestry  5 / 50 = 10.0 %  02/09/23: Modified Oswestry: 1/50=2%   COGNITION: Overall cognitive status: Within functional limits for tasks assessed     SENSATION: Not tested  MUSCLE LENGTH: Mild tightness B HS, tight paraspinals  POSTURE: anterior pelvic tilt  PALPATION: unremarkable  LUMBAR ROM: Full pain with left SB   LOWER EXTREMITY ROM:   WNL   LOWER EXTREMITY MMT:  5/5   LUMBAR SPECIAL TESTS:  Quadrant test: Positive   TODAY'S TREATMENT:    DATE:   02/09/23  Seated hamstring stretch and figure 4 stretch Ball roll outs x5 each direction Sit to stand with 5# kettle bell 2x10 with TA and gluteal activation Nustep Level 3x 6 minutes-PT present to discuss progress  DATE:   02/02/23  Seated hamstring stretch and figure 4 stretch Sit to stand with 5# kettle bell 2x10 with TA and gluteal activation Seated on ball: horizontal abduction and ER with red band x20  Supine TA activation: ball squeeze 5" hold x 10 Bridge with ball squeeze x10 Open book x 10 bil   01/19/23  Seated hamstring stretch and figure 4 stretch Open book stretch Trigger Point Dry-Needling  Treatment instructions: Expect mild to moderate muscle soreness. S/S of pneumothorax if dry needled over a lung field, and to seek immediate medical attention should they occur. Patient verbalized understanding of these instructions and education.  Patient Consent Given: Yes Education handout provided: No Muscles treated: bil lumbar multifidi Treatment  response/outcome: Utilized skilled palpation to identify trigger points.  During dry needling able to palpate muscle twitch and muscle elongation   Skilled palpation and monitoring by PT during dry needling                                                                                                                        PATIENT EDUCATION:  Education details: anticipated POC, initial HEP, and postural awareness, Access Code: [email or web address]  Person educated: Patient Education method: Explanation, Demonstration, Tactile cues, Verbal cues, and Handouts Education comprehension: verbalized understanding, returned demonstration, and verbal cues required  HOME EXERCISE PROGRAM: Access Code: [email or web address] URL: [email or web address] Date: 01/19/2023 Prepared by: Tresa Endo  Exercises - Supine Posterior Pelvic Tilt  - 2 x daily - 7 x weekly - 1-2 sets - 10 reps - 5 sec hold - Supine Transversus Abdominis Bracing with Double Leg Fallout  - 2 x daily - 7 x weekly - 1-2 sets - 10 reps - Beginner Knee Fold  - 1 x daily - 3-4 x weekly - 1 sets - 10 reps - Plank on Knees  - 1 x daily - 7 x weekly - 1 sets - 5 reps - max hold hold - Seated Hamstring Stretch  - 2 x daily - 7 x weekly - 1 sets - 3 reps - 20 hold - Seated Figure 4 Piriformis Stretch  - 2 x daily - 7 x weekly - 1 sets - 3 reps - 20 hold - Supine Piriformis Stretch with  Leg Straight  - 2 x daily - 7 x weekly - 1 sets - 3 reps - 20 hold - Sidelying Open Book Thoracic Lumbar Rotation and Extension  - 2 x daily - 7 x weekly - 1 sets - 10 reps  ASSESSMENT:  CLINICAL IMPRESSION: Pt had a flare-up of pain yesterday due to sitting on the floor the night before x 2 hours.  Pain has now resolved and she denies any pain today.  She continues to report 80% overall pain reduction.   PT monitored for technique and pain throughout session.  Pt would like to have another session in the next week or 2 for DN.   Patient will benefit from skilled PT to  address the below impairments and improve overall function.    OBJECTIVE IMPAIRMENTS: decreased strength, impaired flexibility, postural dysfunction, and pain.   ACTIVITY LIMITATIONS: standing, transfers, and bed mobility  PARTICIPATION LIMITATIONS: occupation  PERSONAL FACTORS:  work schedule  are also affecting patient's functional outcome.   REHAB POTENTIAL: Excellent  CLINICAL DECISION MAKING: Stable/uncomplicated  EVALUATION COMPLEXITY: Low   GOALS: Goals reviewed with patient? Yes  SHORT TERM GOALS: Target date: 12/31/22  Ind with HEP Baseline: Goal status:MET  2.  Decreased back and leg pain by 25% with ADLS Baseline: 80% (02/02/23) Goal status:MET   LONG TERM GOALS: Target date: 02/04/23  Ind with advanced HEP for core strengthening Baseline:  Goal status: INITIAL  2.  No radicular sx reported into B LE Baseline: very intermittent now (02/02/23) Goal status: INITIAL  3.  Able to perform sit to stand and bed mobility without increased pain Baseline:  Goal status: INITIAL  4.  Decreased Low back pain by 75% with ADLs to improve QOL. Baseline: 80% overall reduction (02/02/23) Goal status: INITIAL  5.  Improved Modified Oswestry to 0/50 showing functional improvement Baseline: 1/50 (02/09/23) Goal status: partially met   PLAN:  PT FREQUENCY: 1x/week  PT DURATION: 4 weeks  PLANNED INTERVENTIONS: Therapeutic exercises, Therapeutic activity, Neuromuscular re-education, Patient/Family education, Self Care, Joint mobilization, Dry Needling, Electrical stimulation, Spinal mobilization, Cryotherapy, Moist heat, Taping, and Manual therapy.  PLAN FOR NEXT SESSION: continue strength and flexibility progression.  1 session for DN probable.    Lorrene Reid, PT 02/09/23 8:03 AM  PHYSICAL THERAPY DISCHARGE SUMMARY  Visits from Start of Care: 5  Current functional level related to goals / functional outcomes: See above for most current PT status.      Remaining deficits:  Intermittent LBP with activity.  Pt has HEP in place to address remaining deficits.     Education / Equipment: HEP   Patient agrees to discharge. Patient goals were partially met. Patient is being discharged due to being pleased with the current functional level.  Lorrene Reid, PT 03/15/23 7:04 AM

## 2023-02-15 DIAGNOSIS — Z803 Family history of malignant neoplasm of breast: Secondary | ICD-10-CM | POA: Diagnosis not present

## 2023-02-15 DIAGNOSIS — N920 Excessive and frequent menstruation with regular cycle: Secondary | ICD-10-CM | POA: Diagnosis not present

## 2023-02-15 DIAGNOSIS — Z01411 Encounter for gynecological examination (general) (routine) with abnormal findings: Secondary | ICD-10-CM | POA: Diagnosis not present

## 2023-02-15 DIAGNOSIS — R102 Pelvic and perineal pain: Secondary | ICD-10-CM | POA: Diagnosis not present

## 2023-02-15 DIAGNOSIS — N6019 Diffuse cystic mastopathy of unspecified breast: Secondary | ICD-10-CM | POA: Diagnosis not present

## 2023-02-15 DIAGNOSIS — Z800 Family history of malignant neoplasm of digestive organs: Secondary | ICD-10-CM | POA: Diagnosis not present

## 2023-02-15 DIAGNOSIS — Z124 Encounter for screening for malignant neoplasm of cervix: Secondary | ICD-10-CM | POA: Diagnosis not present

## 2023-02-15 DIAGNOSIS — Z113 Encounter for screening for infections with a predominantly sexual mode of transmission: Secondary | ICD-10-CM | POA: Diagnosis not present

## 2023-02-16 ENCOUNTER — Other Ambulatory Visit: Payer: Self-pay | Admitting: Obstetrics and Gynecology

## 2023-02-16 DIAGNOSIS — N6011 Diffuse cystic mastopathy of right breast: Secondary | ICD-10-CM

## 2023-02-22 ENCOUNTER — Other Ambulatory Visit: Payer: Self-pay | Admitting: Obstetrics and Gynecology

## 2023-02-22 DIAGNOSIS — N6011 Diffuse cystic mastopathy of right breast: Secondary | ICD-10-CM

## 2023-02-24 ENCOUNTER — Ambulatory Visit: Payer: 59 | Admitting: Rehabilitative and Restorative Service Providers"

## 2023-03-01 ENCOUNTER — Other Ambulatory Visit: Payer: Self-pay

## 2023-03-01 DIAGNOSIS — R1084 Generalized abdominal pain: Secondary | ICD-10-CM

## 2023-03-03 ENCOUNTER — Other Ambulatory Visit: Payer: Self-pay | Admitting: Physician Assistant

## 2023-03-03 DIAGNOSIS — R1084 Generalized abdominal pain: Secondary | ICD-10-CM

## 2023-03-04 ENCOUNTER — Ambulatory Visit (HOSPITAL_BASED_OUTPATIENT_CLINIC_OR_DEPARTMENT_OTHER)
Admission: RE | Admit: 2023-03-04 | Discharge: 2023-03-04 | Disposition: A | Discharge status: Home / Self Care | Payer: 59 | Source: Ambulatory Visit | Attending: Physician Assistant | Admitting: Physician Assistant

## 2023-03-04 ENCOUNTER — Ambulatory Visit: Payer: Self-pay | Admitting: Sports Medicine

## 2023-03-04 DIAGNOSIS — K5730 Diverticulosis of large intestine without perforation or abscess without bleeding: Secondary | ICD-10-CM | POA: Diagnosis not present

## 2023-03-04 DIAGNOSIS — R932 Abnormal findings on diagnostic imaging of liver and biliary tract: Secondary | ICD-10-CM | POA: Diagnosis not present

## 2023-03-04 DIAGNOSIS — R1084 Generalized abdominal pain: Secondary | ICD-10-CM | POA: Diagnosis not present

## 2023-03-04 DIAGNOSIS — R102 Pelvic and perineal pain: Secondary | ICD-10-CM | POA: Diagnosis not present

## 2023-03-04 MED ORDER — IOHEXOL 300 MG/ML  SOLN
100.0000 mL | Freq: Once | INTRAMUSCULAR | Status: AC | PRN
  Administered 2023-03-04: 100 mL via INTRAVENOUS

## 2023-03-12 ENCOUNTER — Telehealth (INDEPENDENT_AMBULATORY_CARE_PROVIDER_SITE_OTHER): Payer: 59 | Admitting: Physician Assistant

## 2023-03-12 ENCOUNTER — Encounter: Payer: Self-pay | Admitting: Physician Assistant

## 2023-03-12 VITALS — Ht 66.0 in | Wt 138.0 lb

## 2023-03-12 DIAGNOSIS — D508 Other iron deficiency anemias: Secondary | ICD-10-CM

## 2023-03-12 DIAGNOSIS — N9489 Other specified conditions associated with female genital organs and menstrual cycle: Secondary | ICD-10-CM

## 2023-03-12 DIAGNOSIS — D649 Anemia, unspecified: Secondary | ICD-10-CM | POA: Insufficient documentation

## 2023-03-12 DIAGNOSIS — R1084 Generalized abdominal pain: Secondary | ICD-10-CM

## 2023-03-12 DIAGNOSIS — E559 Vitamin D deficiency, unspecified: Secondary | ICD-10-CM | POA: Diagnosis not present

## 2023-03-12 DIAGNOSIS — R935 Abnormal findings on diagnostic imaging of other abdominal regions, including retroperitoneum: Secondary | ICD-10-CM

## 2023-03-12 DIAGNOSIS — K5790 Diverticulosis of intestine, part unspecified, without perforation or abscess without bleeding: Secondary | ICD-10-CM | POA: Diagnosis not present

## 2023-03-12 NOTE — Progress Notes (Signed)
Virtual Visit via Video Note  I connected with  Michele Arias  on 03/12/23 at  2:00 PM EST by a video enabled telemedicine application and verified that I am speaking with the correct person using two identifiers.  Location: Patient: parked car at work in [employer] Provider: Nature conservation officer at [employer] Persons present: Patient and myself   I discussed the limitations of evaluation and management by telemedicine and the availability of in person appointments. The patient expressed understanding and agreed to proceed.   History of Present Illness:  Discussed the use of AI scribe software for clinical note transcription with the patient, who gave verbal consent to proceed.  History of Present Illness   Pt with a history of abdominal and pelvic pain, presents for a discussion of her recent CT scan results. She reports that her pain has been ongoing since last year, but denies any acute episodes that required hospitalization. She also experiences urinary urgency, particularly painful when she needs to urinate. Her menstrual cycles have become increasingly painful, with the onset of blood clots and spotting occurring up to nine days before her cycle. She also reports leg pain, which she initially attributed to tired leg syndrome. She has previously taken iron supplements, which she discontinued due to stomach discomfort and constipation. She also completed a course of Vitamin D supplements.        Observations/Objective:   Gen: Awake, alert, no acute distress Resp: Breathing is even and non-labored Psych: calm/pleasant demeanor Neuro: Alert and Oriented x 3, + facial symmetry, speech is clear.   Assessment and Plan:  Reviewed CT abd/pelvis results with patient:  CT ABDOMEN PELVIS W CONTRAST CLINICAL DATA:  Generalized abdominal pain, lower pelvic pain for several months.  EXAM: CT ABDOMEN AND PELVIS WITH CONTRAST  TECHNIQUE: Multidetector CT imaging of the abdomen  and pelvis was performed using the standard protocol following bolus administration of intravenous contrast.  RADIATION DOSE REDUCTION: This exam was performed according to the departmental dose-optimization program which includes automated exposure control, adjustment of the mA and/or kV according to patient size and/or use of iterative reconstruction technique.  CONTRAST:  OMNIPAQUE IOHEXOL 300 MG/ML  SOLN  COMPARISON:  None Available.  FINDINGS: Lower chest: No acute abnormality. Right lower lobe calcified granuloma.  Hepatobiliary: Scattered sub/pericentimeter bilobar hepatic lesions are technically too small to accurately characterize but statistically likely to reflect benign cysts or hemangiomata. Gallbladder is unremarkable. No biliary ductal dilation.  Pancreas: No pancreatic ductal dilation or evidence of acute inflammation.  Spleen: No splenomegaly.  Adrenals/Urinary Tract: Bilateral adrenal glands appear normal. No hydronephrosis. Kidneys demonstrate symmetric enhancement. Urinary bladder is minimally distended limiting evaluation.  Stomach/Bowel: Stomach is unremarkable for degree of distension. No pathologic dilation of small or large bowel. Normal appendix. Colonic diverticulosis without findings of acute diverticulitis.  Vascular/Lymphatic: Normal caliber abdominal aorta. Smooth IVC contours. No pathologically enlarged abdominal or pelvic lymph nodes. Dilated gonadal veins and pelvic collateral vessels.  Reproductive: Fluid in the endometrial canal with heterogeneous uterine enhancement may be within normal limits based on timing of patient's menstrual cycle. No suspicious adnexal mass.  Other: Trace pelvic free fluid is within physiologic normal limits.  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  IMPRESSION: 1. No acute abnormality in the abdomen or pelvis. 2. Colonic diverticulosis without findings of  acute diverticulitis. 3. Dilated gonadal veins and pelvic collateral vessels, which can be seen in the setting of pelvic congestion syndrome. 4. Scattered sub/pericentimeter bilobar  hepatic lesions are technically too small to accurately characterize but statistically likely to reflect benign cysts or hemangiomata.  Electronically Signed   By: Maudry Mayhew M.D.   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 Symptoms include pain before periods, with prolonged standing, posture changes, and frequent urination. -Referral to Vein and Vascular for potential sclerotherapy. -Consider wearing compression socks to support venous circulation in the legs.  Diverticulosis Noted on CT scan, no acute diverticulitis. Patient has no history of diverticulitis flare-ups. -Referral to Gastroenterology for further evaluation and potential colonoscopy.  Iron Deficiency History of low ferritin levels, symptoms of restless leg syndrome. -Resume over-the-counter slow-release iron supplement, adjust dose as needed to manage potential constipation. -Continue over-the-counter Vitamin D supplement at 1000-2000 units daily.  General Health Maintenance -Consider discussing with Gynecologist for potential hormonal treatment options for Pelvic Congestion Syndrome.        Follow Up Instructions:    I discussed the assessment and treatment plan with the patient. The patient was provided an opportunity to ask questions and all were answered. The patient agreed with the plan and demonstrated an understanding of the instructions.   The patient was advised to call back or seek an in-person evaluation if the symptoms worsen or if the condition fails to improve as anticipated.  Rocklin Soderquist M Laurine Kuyper, PA-C

## 2023-03-12 NOTE — Patient Instructions (Signed)
VISIT SUMMARY:  Today, we discussed the results of your recent CT scan and addressed your ongoing symptoms, including abdominal and pelvic pain, urinary urgency, painful menstrual cycles, and leg pain. We reviewed your history of iron deficiency and the steps you have taken to manage it.  YOUR PLAN:  -PELVIC CONGESTION SYNDROME: Pelvic Congestion Syndrome is a condition where varicose veins form in the lower abdomen, causing chronic pelvic pain. We will refer you to a Vein and Vascular specialist to discuss potential sclerotherapy, a treatment to close off these veins. Additionally, consider wearing compression socks to help with leg pain and support venous circulation.  -DIVERTICULOSIS: Diverticulosis is a condition where small pouches form in the walls of the colon. While you do not have an active infection, we will refer you to a Gastroenterologist for further evaluation and a potential colonoscopy to monitor the condition.  -IRON DEFICIENCY: Iron deficiency occurs when your body lacks enough iron, leading to symptoms like restless leg syndrome. We recommend resuming an over-the-counter slow-release iron supplement and adjusting the dose as needed to manage constipation. Continue taking your Vitamin D supplement at 1000-2000 units daily.  -GENERAL HEALTH MAINTENANCE: For your general health, consider discussing potential hormonal treatment options for Pelvic Congestion Syndrome with your Gynecologist.  INSTRUCTIONS:  Please follow up with the Vein and Vascular specialist for your Pelvic Congestion Syndrome and the Gastroenterologist for your Diverticulosis. Continue your iron and Vitamin D supplements as discussed. Consider scheduling an appointment with your Gynecologist to explore hormonal treatment options.

## 2023-03-24 ENCOUNTER — Ambulatory Visit: Payer: Self-pay | Admitting: Physician Assistant

## 2023-03-29 ENCOUNTER — Ambulatory Visit: Payer: 59 | Admitting: Physician Assistant

## 2023-03-29 VITALS — BP 118/73 | HR 100 | Temp 97.4°F | Ht 66.0 in | Wt 135.4 lb

## 2023-03-29 DIAGNOSIS — E559 Vitamin D deficiency, unspecified: Secondary | ICD-10-CM | POA: Diagnosis not present

## 2023-03-29 DIAGNOSIS — R790 Abnormal level of blood mineral: Secondary | ICD-10-CM

## 2023-03-29 DIAGNOSIS — R102 Pelvic and perineal pain: Secondary | ICD-10-CM | POA: Diagnosis not present

## 2023-03-29 DIAGNOSIS — Z0000 Encounter for general adult medical examination without abnormal findings: Secondary | ICD-10-CM | POA: Diagnosis not present

## 2023-03-29 DIAGNOSIS — N920 Excessive and frequent menstruation with regular cycle: Secondary | ICD-10-CM | POA: Diagnosis not present

## 2023-03-29 LAB — CBC WITH DIFFERENTIAL/PLATELET
Basophils Absolute: 0 10*3/uL (ref 0.0–0.1)
Basophils Relative: 1 % (ref 0.0–3.0)
Eosinophils Absolute: 0.1 10*3/uL (ref 0.0–0.7)
Eosinophils Relative: 3.1 % (ref 0.0–5.0)
HCT: 32 % — ABNORMAL LOW (ref 36.0–46.0)
Hemoglobin: 10.9 g/dL — ABNORMAL LOW (ref 12.0–15.0)
Lymphocytes Relative: 35.6 % (ref 12.0–46.0)
Lymphs Abs: 1.3 10*3/uL (ref 0.7–4.0)
MCHC: 34.3 g/dL (ref 30.0–36.0)
MCV: 89.6 fL (ref 78.0–100.0)
Monocytes Absolute: 0.3 10*3/uL (ref 0.1–1.0)
Monocytes Relative: 7.9 % (ref 3.0–12.0)
Neutro Abs: 1.9 10*3/uL (ref 1.4–7.7)
Neutrophils Relative %: 52.4 % (ref 43.0–77.0)
Platelets: 282 10*3/uL (ref 150.0–400.0)
RBC: 3.57 Mil/uL — ABNORMAL LOW (ref 3.87–5.11)
RDW: 13.8 % (ref 11.5–15.5)
WBC: 3.6 10*3/uL — ABNORMAL LOW (ref 4.0–10.5)

## 2023-03-29 LAB — LIPID PANEL
Cholesterol: 139 mg/dL (ref 0–200)
HDL: 40.7 mg/dL (ref >39.00)
LDL Cholesterol: 83 mg/dL (ref 0–99)
NonHDL: 98.55
Total CHOL/HDL Ratio: 3
Triglycerides: 78 mg/dL (ref 0.0–149.0)
VLDL: 15.6 mg/dL (ref 0.0–40.0)

## 2023-03-29 LAB — COMPREHENSIVE METABOLIC PANEL
ALT: 11 U/L (ref 0–35)
AST: 15 U/L (ref 0–37)
Albumin: 4.1 g/dL (ref 3.5–5.2)
Alkaline Phosphatase: 36 U/L — ABNORMAL LOW (ref 39–117)
BUN: 10 mg/dL (ref 6–23)
CO2: 26 meq/L (ref 19–32)
Calcium: 8.9 mg/dL (ref 8.4–10.5)
Chloride: 106 meq/L (ref 96–112)
Creatinine, Ser: 0.72 mg/dL (ref 0.40–1.20)
GFR: 106.86 mL/min (ref >60.00)
Glucose, Bld: 73 mg/dL (ref 70–99)
Potassium: 3.6 meq/L (ref 3.5–5.1)
Sodium: 138 meq/L (ref 135–145)
Total Bilirubin: 0.7 mg/dL (ref 0.2–1.2)
Total Protein: 7 g/dL (ref 6.0–8.3)

## 2023-03-29 LAB — TSH: TSH: 1.3 u[IU]/mL (ref 0.35–5.50)

## 2023-03-29 LAB — IBC + FERRITIN
Ferritin: 6.3 ng/mL — ABNORMAL LOW (ref 10.0–291.0)
Iron: 55 ug/dL (ref 42–145)
Saturation Ratios: 12.6 % — ABNORMAL LOW (ref 20.0–50.0)
TIBC: 436.8 ug/dL (ref 250.0–450.0)
Transferrin: 312 mg/dL (ref 212.0–360.0)

## 2023-03-29 LAB — VITAMIN D 25 HYDROXY (VIT D DEFICIENCY, FRACTURES): VITD: 19.27 ng/mL — ABNORMAL LOW (ref 30.00–100.00)

## 2023-03-29 LAB — HEMOGLOBIN A1C: Hgb A1c MFr Bld: 5.5 % (ref 4.6–6.5)

## 2023-03-29 NOTE — Progress Notes (Signed)
Patient ID: Michele Arias, female    DOB: 1985/05/06, 37 y.o.   MRN: [id number]   Assessment & Plan:  Encounter for annual physical exam -     CBC with Differential/Platelet -     Comprehensive metabolic panel -     Lipid panel -     TSH -     Hemoglobin A1c -     IBC + Ferritin  Low ferritin  Vitamin D deficiency -     VITAMIN D 25 Hydroxy (Vit-D Deficiency, Fractures)    Age-appropriate screening and counseling performed today. Will check labs and call with results. Preventive measures discussed and printed in AVS for patient.   Patient Counseling: [x]   Nutrition: Stressed importance of moderation in sodium/caffeine intake, saturated fat and cholesterol, caloric balance, sufficient intake of fresh fruits, vegetables, and fiber.  [x]   Stressed the importance of regular exercise.   []   Substance Abuse: Discussed cessation/primary prevention of tobacco, alcohol, or other drug use; driving or other dangerous activities under the influence; availability of treatment for abuse.   []   Injury prevention: Discussed safety belts, safety helmets, smoke detector, smoking near bedding or upholstery.   []   Sexuality: Discussed sexually transmitted diseases, partner selection, use of condoms, avoidance of unintended pregnancy  and contraceptive alternatives.   [x]   Dental health: Discussed importance of regular tooth brushing, flossing, and dental visits.  [x]   Health maintenance and immunizations reviewed. Please refer to Health maintenance section.        Return in about 6 months (around 09/27/2023) for recheck/follow-up.    Subjective:    Chief Complaint  Patient presents with   Annual Exam    Patient is fasting    HPI Patient is in today for annual exam.  Acute concerns: Random dizzy spells, hx low iron- wants to recheck iron levels today   Health maintenance: Lifestyle/ exercise: active at work  Nutrition: water intake OK, appetite is decreased - lost about 10 lbs  this last year Mental health: doing good overall, some stress Caffeine: none otherwise has anxiety and some heart flutters  Sleep: trouble falling asleep - sometimes takes Ashwaghanda  Substance use: none  Sexual activity: monogamous  Immunizations: usually declines flu shots  Pap: UTD with GYN    Past Medical History:  Diagnosis Date   Anxiety    Depression    Medical history non-contributory    UTI (urinary tract infection)     Past Surgical History:  Procedure Laterality Date   NO PAST SURGERIES      Family History  Problem Relation Age of Onset   Alcohol abuse Mother    Depression Mother    Drug abuse Mother    Hypertension Mother    Mental illness Mother    Bipolar disorder Mother    Alcohol abuse Father    Drug abuse Sister    Mental illness Sister    Alcohol abuse Brother    Depression Brother    Mental illness Brother    Cancer Maternal Grandmother    Hypertension Maternal Grandmother    Stroke Maternal Grandmother    Cancer Paternal Grandmother    Miscarriages / Stillbirths Sister    Alcohol abuse Brother    Drug abuse Brother     Social History   Tobacco Use   Smoking status: Never   Smokeless tobacco: Never  Vaping Use   Vaping status: Never Used  Substance Use Topics   Alcohol use: Yes    Comment: rarely  Drug use: No     No Known Allergies  Review of Systems NEGATIVE UNLESS OTHERWISE INDICATED IN HPI      Objective:     BP 118/73   Pulse 100   Temp (!) 97.4 F (36.3 C) (Temporal)   Ht 5\' 6"  (1.676 m)   Wt 135 lb 6.4 oz (61.4 kg)   SpO2 100%   BMI 21.85 kg/m   Wt Readings from Last 3 Encounters:  03/29/23 135 lb 6.4 oz (61.4 kg)  03/12/23 138 lb (62.6 kg)  02/03/23 138 lb 12.8 oz (63 kg)    BP Readings from Last 3 Encounters:  03/29/23 118/73  02/03/23 104/62  01/21/23 118/84     Physical Exam Vitals and nursing note reviewed.  Constitutional:      Appearance: Normal appearance. She is normal weight. She is not  toxic-appearing.  HENT:     Head: Normocephalic and atraumatic.     Right Ear: Tympanic membrane, ear canal and external ear normal.     Left Ear: Tympanic membrane, ear canal and external ear normal.     Nose: Nose normal.     Mouth/Throat:     Mouth: Mucous membranes are moist.  Eyes:     Extraocular Movements: Extraocular movements intact.     Conjunctiva/sclera: Conjunctivae normal.     Pupils: Pupils are equal, round, and reactive to light.  Cardiovascular:     Rate and Rhythm: Normal rate and regular rhythm.     Pulses: Normal pulses.     Heart sounds: Normal heart sounds.  Pulmonary:     Effort: Pulmonary effort is normal.     Breath sounds: Normal breath sounds.  Abdominal:     General: Abdomen is flat. Bowel sounds are normal.     Palpations: Abdomen is soft.  Musculoskeletal:        General: Normal range of motion.     Cervical back: Normal range of motion and neck supple.  Skin:    General: Skin is warm and dry.  Neurological:     General: No focal deficit present.     Mental Status: She is alert and oriented to person, place, and time.  Psychiatric:        Mood and Affect: Mood normal.        Behavior: Behavior normal.        Thought Content: Thought content normal.        Judgment: Judgment normal.          Providence Stivers M Maycie Luera, PA-C

## 2023-05-06 ENCOUNTER — Encounter: Payer: Self-pay | Admitting: Obstetrics and Gynecology

## 2023-05-20 ENCOUNTER — Encounter: Payer: Self-pay | Admitting: Pediatrics

## 2023-05-25 ENCOUNTER — Other Ambulatory Visit: Payer: Self-pay | Admitting: *Deleted

## 2023-05-25 DIAGNOSIS — N9489 Other specified conditions associated with female genital organs and menstrual cycle: Secondary | ICD-10-CM

## 2023-06-04 DIAGNOSIS — J019 Acute sinusitis, unspecified: Secondary | ICD-10-CM | POA: Diagnosis not present

## 2023-06-04 DIAGNOSIS — Z03818 Encounter for observation for suspected exposure to other biological agents ruled out: Secondary | ICD-10-CM | POA: Diagnosis not present

## 2023-06-04 DIAGNOSIS — H9209 Otalgia, unspecified ear: Secondary | ICD-10-CM | POA: Diagnosis not present

## 2023-06-04 DIAGNOSIS — R0981 Nasal congestion: Secondary | ICD-10-CM | POA: Diagnosis not present

## 2023-06-07 ENCOUNTER — Other Ambulatory Visit: Payer: Self-pay | Admitting: Surgery

## 2023-06-07 ENCOUNTER — Encounter: Payer: Self-pay | Admitting: Surgery

## 2023-06-07 ENCOUNTER — Ambulatory Visit (INDEPENDENT_AMBULATORY_CARE_PROVIDER_SITE_OTHER): Payer: Commercial Managed Care - PPO | Admitting: Surgery

## 2023-06-07 ENCOUNTER — Ambulatory Visit (HOSPITAL_COMMUNITY)
Admission: RE | Admit: 2023-06-07 | Discharge: 2023-06-07 | Disposition: A | Discharge status: Home / Self Care | Payer: Commercial Managed Care - PPO | Source: Ambulatory Visit | Attending: Surgery | Admitting: Surgery

## 2023-06-07 VITALS — BP 113/73 | HR 58 | Temp 98.0°F | Resp 18 | Ht 66.0 in | Wt 131.6 lb

## 2023-06-07 DIAGNOSIS — I872 Venous insufficiency (chronic) (peripheral): Secondary | ICD-10-CM | POA: Diagnosis not present

## 2023-06-07 DIAGNOSIS — I878 Other specified disorders of veins: Secondary | ICD-10-CM

## 2023-06-07 DIAGNOSIS — N9489 Other specified conditions associated with female genital organs and menstrual cycle: Secondary | ICD-10-CM | POA: Diagnosis not present

## 2023-06-07 NOTE — Progress Notes (Signed)
[facility]ist [facility]  Patient name: Michele Arias MRN: [id number] DOB: 12/10/1985 Sex: female   REQUESTING PROVIDER:    Alyssa Altwardt   REASON FOR CONSULT:    Pelvic venous congestion syndrome  HISTORY OF PRESENT ILLNESS:   Michele Arias is a 38 y.o. female, who is is here for evaluation of chronic lower abdominal pain.  She has been worked up by her gynecologist.  She denies any GYN issues including endometriosis.  She complains of dyspareunia.  Her symptoms are worse at the end of the day.  She does not have any labial varicosities or lower extremity varicosities.  She recently underwent CT scan which showed pelvic venous congestion.  PAST MEDICAL HISTORY    Past Medical History:  Diagnosis Date   Anxiety    Depression    Medical history non-contributory    UTI (urinary tract infection)      FAMILY HISTORY   Family History  Problem Relation Age of Onset   Alcohol abuse Mother    Depression Mother    Drug abuse Mother    Hypertension Mother    Mental illness Mother    Bipolar disorder Mother    Alcohol abuse Father    Drug abuse Sister    Mental illness Sister    Alcohol abuse Brother    Depression Brother    Mental illness Brother    Cancer Maternal Grandmother    Hypertension Maternal Grandmother    Stroke Maternal Grandmother    Cancer Paternal Grandmother    Miscarriages / Stillbirths Sister    Alcohol abuse Brother    Drug abuse Brother     SOCIAL HISTORY:   Social History   Socioeconomic History   Marital status: Married    Spouse name: Not on file   Number of children: Not on file   Years of education: Not on file   Highest education level: Not on file  Occupational History   Not on file  Tobacco Use   Smoking status: Never   Smokeless tobacco: Never  Vaping Use   Vaping status: Never Used  Substance and Sexual Activity   Alcohol use: Yes    Comment: rarely   Drug use: No    Sexual activity: Not Currently    Partners: Male    Birth control/protection: Surgical    Comment: husband-vasectomy  Other Topics Concern   Not on file  Social History Narrative   Left handed   Lives with husband and to children in a two story   Caffeine rarely   Social Drivers of Corporate investment banker Strain: Not on file  Food Insecurity: Not on file  Transportation Needs: Not on file  Physical Activity: Not on file  Stress: Not on file  Social Connections: Not on file  Intimate Partner Violence: Not on file    ALLERGIES:    No Known Allergies  CURRENT MEDICATIONS:    Current Outpatient Medications  Medication Sig Dispense Refill   amoxicillin-clavulanate (AUGMENTIN) 875-125 MG tablet SMARTSIG:1 Tablet(s) By Mouth Every 12 Hours     No current facility-administered medications for this visit.    REVIEW OF SYSTEMS:   [X]  denotes positive finding, [ ]  denotes negative finding Cardiac  Comments:  Chest pain or chest pressure:    Shortness of breath upon exertion:    Short of breath when lying flat:    Irregular heart rhythm:        Vascular    Pain in calf,  thigh, or hip brought on by ambulation:    Pain in feet at night that wakes you up from your sleep:     Blood clot in your veins:    Leg swelling:         Pulmonary    Oxygen at home:    Productive cough:     Wheezing:         Neurologic    Sudden weakness in arms or legs:     Sudden numbness in arms or legs:     Sudden onset of difficulty speaking or slurred speech:    Temporary loss of vision in one eye:     Problems with dizziness:         Gastrointestinal    Blood in stool:      Vomited blood:         Genitourinary    Burning when urinating:     Blood in urine:        Psychiatric    Major depression:         Hematologic    Bleeding problems:    Problems with blood clotting too easily:        Skin    Rashes or ulcers:        Constitutional    Fever or chills:      PHYSICAL EXAM:   Vitals:   06/07/23 1349  BP: 113/73  Pulse: (!) 58  Resp: 18  Temp: 98 F (36.7 C)  TempSrc: Temporal  SpO2: 93%  Weight: 131 lb 9.6 oz (59.7 kg)  Height: 5\' 6"  (1.676 m)    GENERAL: The patient is a well-nourished female, in no acute distress. The vital signs are documented above. CARDIAC: There is a regular rate and rhythm.  PULMONARY: Nonlabored respirations ABDOMEN: Soft and non-tender  MUSCULOSKELETAL: There are no major deformities or cyanosis. NEUROLOGIC: No focal weakness or paresthesias are detected. SKIN: There are no ulcers or rashes noted. PSYCHIATRIC: The patient has a normal affect.  STUDIES:   I have reviewed the following CT scan:  1. No acute abnormality in the abdomen or pelvis. 2. Colonic diverticulosis without findings of acute diverticulitis. 3. Dilated gonadal veins and pelvic collateral vessels, which can be seen in the setting of pelvic congestion syndrome. 4. Scattered sub/pericentimeter bilobar hepatic lesions are technically too small to accurately characterize but statistically likely to reflect benign cysts or hemangiomata.    ASSESSMENT and PLAN   Pelvic venous congestion: The patient's symptoms are very suggestive of pelvic venous insufficiency.  These have gotten worse and are very bothersome to her.  I discussed that her CT scan does show significant dilation of her gonadal veins with pelvic collaterals.  I think that she would benefit from intervention to address this issue as this is likely the explanation for her symptoms.  I have spoken with Dr. Brunetta Jeans with interventional radiology, and I have scheduled an appointment for her to see him to discuss possible intervention.   Michele Cross, MD, FACS [facility] [phone] Pager [phone]

## 2023-06-08 ENCOUNTER — Encounter: Payer: Self-pay | Admitting: Surgery

## 2023-06-08 ENCOUNTER — Ambulatory Visit
Admission: RE | Admit: 2023-06-08 | Discharge: 2023-06-08 | Disposition: A | Discharge status: Home / Self Care | Payer: 59 | Source: Ambulatory Visit | Attending: Obstetrics and Gynecology | Admitting: Obstetrics and Gynecology

## 2023-06-08 ENCOUNTER — Ambulatory Visit
Admission: RE | Admit: 2023-06-08 | Discharge: 2023-06-08 | Disposition: A | Discharge status: Home / Self Care | Payer: Commercial Managed Care - PPO | Source: Ambulatory Visit | Attending: Obstetrics and Gynecology

## 2023-06-08 ENCOUNTER — Ambulatory Visit: Payer: 59

## 2023-06-08 DIAGNOSIS — R92343 Mammographic extreme density, bilateral breasts: Secondary | ICD-10-CM | POA: Diagnosis not present

## 2023-06-08 DIAGNOSIS — N6011 Diffuse cystic mastopathy of right breast: Secondary | ICD-10-CM

## 2023-06-08 DIAGNOSIS — N6012 Diffuse cystic mastopathy of left breast: Secondary | ICD-10-CM | POA: Diagnosis not present

## 2023-06-10 ENCOUNTER — Other Ambulatory Visit: Payer: Commercial Managed Care - PPO

## 2023-06-24 ENCOUNTER — Other Ambulatory Visit: Payer: Self-pay | Admitting: Interventional Radiology

## 2023-06-24 ENCOUNTER — Ambulatory Visit
Admission: RE | Admit: 2023-06-24 | Discharge: 2023-06-24 | Disposition: A | Discharge status: Home / Self Care | Payer: Commercial Managed Care - PPO | Source: Ambulatory Visit | Attending: Surgery | Admitting: Surgery

## 2023-06-24 ENCOUNTER — Other Ambulatory Visit: Payer: Self-pay | Admitting: Physician Assistant

## 2023-06-24 DIAGNOSIS — N9489 Other specified conditions associated with female genital organs and menstrual cycle: Secondary | ICD-10-CM

## 2023-06-24 DIAGNOSIS — R1084 Generalized abdominal pain: Secondary | ICD-10-CM

## 2023-06-24 DIAGNOSIS — N941 Unspecified dyspareunia: Secondary | ICD-10-CM | POA: Diagnosis not present

## 2023-06-24 HISTORY — PX: IR RADIOLOGIST EVAL & MGMT: IMG5224

## 2023-06-24 NOTE — Consult Note (Signed)
 Chief Complaint: Patient was seen in consultation today for  at the request of Brabham,Vance W  Referring Physician(s): Coral Else W  History of Present Illness: Michele Arias is a 38 y.o. female with a 1 year history of chronic pelvic and bilateral lower extremity pain.  She describes her symptoms as intermittent shooting pains from her vagina into her lower abdomen.  Additionally, she has symptoms of pelvic heaviness which extends into her thighs bilaterally.  She also confirms significant dyspareunia.  Her cycles are occasionally irregular however she is under the care of of her obstetrician.  Pap smears and routine health maintenance have all been completed and are normal.  She is married and has 2 children.  Her symptoms are not present when she first wakes up in the morning.  However, the longer she spends time sitting up or on her feet the more intense her symptoms become.  She is finding it difficult to walk or exercise.  In particular, the dyspareunia is very distressing to both her and her husband.  Past Medical History:  Diagnosis Date   Anxiety    Depression    Medical history non-contributory    UTI (urinary tract infection)     Past Surgical History:  Procedure Laterality Date   IR RADIOLOGIST EVAL & MGMT  06/24/2023   NO PAST SURGERIES      Allergies: Patient has no known allergies.  Medications: Prior to Admission medications   Medication Sig Start Date End Date Taking? Authorizing Provider  amoxicillin-clavulanate (AUGMENTIN) 875-125 MG tablet SMARTSIG:1 Tablet(s) By Mouth Every 12 Hours 06/04/23   [provider]     Family History  Problem Relation Age of Onset   Alcohol abuse Mother    Depression Mother    Drug abuse Mother    Hypertension Mother    Mental illness Mother    Bipolar disorder Mother    Alcohol abuse Father    Drug abuse Sister    Mental illness Sister    Miscarriages / [place] Sister    Breast cancer Maternal  Grandmother        ? age of diagnosis   Hypertension Maternal Grandmother    Stroke Maternal Grandmother    Cancer Paternal Grandmother    Alcohol abuse Brother    Depression Brother    Mental illness Brother    Alcohol abuse Brother    Drug abuse Brother     Social History   Socioeconomic History   Marital status: Married    Spouse name: Not on file   Number of children: Not on file   Years of education: Not on file   Highest education level: Not on file  Occupational History   Not on file  Tobacco Use   Smoking status: Never   Smokeless tobacco: Never  Vaping Use   Vaping status: Never Used  Substance and Sexual Activity   Alcohol use: Yes    Comment: rarely   Drug use: No   Sexual activity: Not Currently    Partners: Male    Birth control/protection: Surgical    Comment: husband-vasectomy  Other Topics Concern   Not on file  Social History Narrative   Left handed   Lives with husband and to children in a two story   Caffeine rarely   Social Drivers of Corporate investment banker Strain: Not on file  Food Insecurity: Not on file  Transportation Needs: Not on file  Physical Activity: Not on file  Stress: Not  on file  Social Connections: Not on file     Review of Systems: A 12 point ROS discussed and pertinent positives are indicated in the HPI above.  All other systems are negative.  Review of Systems  Vital Signs: BP 118/71 (BP Location: Left Arm, Patient Position: Sitting)   Pulse 76   Temp 98.5 F (36.9 C) (Oral)   Resp 14   LMP 05/18/2023 (Approximate)   SpO2 98%     Physical Exam Constitutional:      General: She is not in acute distress.    Appearance: Normal appearance. She is normal weight.  HENT:     Head: Normocephalic and atraumatic.  Eyes:     General: No scleral icterus. Cardiovascular:     Rate and Rhythm: Normal rate.  Pulmonary:     Effort: Pulmonary effort is normal.  Abdominal:     General: Abdomen is flat. There is  no distension.     Palpations: Abdomen is soft.     Tenderness: There is no abdominal tenderness.  Skin:    General: Skin is warm and dry.  Neurological:     Mental Status: She is alert and oriented to person, place, and time.  Psychiatric:        Mood and Affect: Mood normal.        Behavior: Behavior normal.       Imaging: IR Radiologist Eval & Mgmt Result Date: 06/24/2023 EXAM: NEW PATIENT OFFICE VISIT CHIEF COMPLAINT: SEE NOTE IN EPIC HISTORY OF PRESENT ILLNESS: SEE NOTE IN EPIC REVIEW OF SYSTEMS: SEE NOTE IN EPIC PHYSICAL EXAMINATION: SEE NOTE IN EPIC ASSESSMENT AND PLAN: SEE NOTE IN EPIC Electronically Signed   By: Malachy Moan M.D.   On: 06/24/2023 10:16   MM 3D DIAGNOSTIC MAMMOGRAM BILATERAL BREAST Result Date: 06/08/2023 CLINICAL DATA:  38 year old female complaining of a palpable abnormality in the upper-outer quadrant of the left breast. Patient states that she has known cysts. EXAM: DIGITAL DIAGNOSTIC BILATERAL MAMMOGRAM WITH TOMOSYNTHESIS AND CAD; ULTRASOUND LEFT BREAST LIMITED TECHNIQUE: Bilateral digital diagnostic mammography and breast tomosynthesis was performed. The images were evaluated with computer-aided detection. ; Targeted ultrasound examination of the left breast was performed. COMPARISON:  Previous exam(s). ACR Breast Density Category d: The breasts are extremely dense, which lowers the sensitivity of mammography. FINDINGS: Radiopaque BB was placed over the palpable abnormality in the upper-outer quadrant of the left breast. There is an obscured ovoid mass in this area. No additional suspicious mass or malignant type microcalcifications identified in either breast. On physical exam, I palpate a discrete mass in the left breast at 2 o'clock 2 cm from the nipple. Targeted ultrasound is performed, showing an ovoid well-circumscribed cluster of anechoic cysts in the left breast at 2 o'clock 2 cm from the nipple measuring 2.2 x 1.4 x 3.1 cm. No suspicious mass  identified. IMPRESSION: Left breast cyst.  No evidence of malignancy in either breast. RECOMMENDATION: Bilateral screening mammography can be deferred until the age of 72 if the clinical exam remains benign/stable. I have discussed the findings and recommendations with the patient. If applicable, a reminder letter will be sent to the patient regarding the next appointment. BI-RADS CATEGORY  2: Benign. Electronically Signed   By: Baird Lyons M.D.   On: 06/08/2023 08:11   [place] LIMITED ULTRASOUND INCLUDING AXILLA LEFT BREAST  Result Date: 06/08/2023 CLINICAL DATA:  39 year old female complaining of a palpable abnormality in the upper-outer quadrant of the left breast. Patient states that she has  known cysts. EXAM: DIGITAL DIAGNOSTIC BILATERAL MAMMOGRAM WITH TOMOSYNTHESIS AND CAD; ULTRASOUND LEFT BREAST LIMITED TECHNIQUE: Bilateral digital diagnostic mammography and breast tomosynthesis was performed. The images were evaluated with computer-aided detection. ; Targeted ultrasound examination of the left breast was performed. COMPARISON:  Previous exam(s). ACR Breast Density Category d: The breasts are extremely dense, which lowers the sensitivity of mammography. FINDINGS: Radiopaque BB was placed over the palpable abnormality in the upper-outer quadrant of the left breast. There is an obscured ovoid mass in this area. No additional suspicious mass or malignant type microcalcifications identified in either breast. On physical exam, I palpate a discrete mass in the left breast at 2 o'clock 2 cm from the nipple. Targeted ultrasound is performed, showing an ovoid well-circumscribed cluster of anechoic cysts in the left breast at 2 o'clock 2 cm from the nipple measuring 2.2 x 1.4 x 3.1 cm. No suspicious mass identified. IMPRESSION: Left breast cyst.  No evidence of malignancy in either breast. RECOMMENDATION: Bilateral screening mammography can be deferred until the age of 36 if the clinical exam remains benign/stable. I  have discussed the findings and recommendations with the patient. If applicable, a reminder letter will be sent to the patient regarding the next appointment. BI-RADS CATEGORY  2: Benign. Electronically Signed   By: Baird Lyons M.D.   On: 06/08/2023 08:11   VAS [place] LOWER EXTREMITY VENOUS REFLUX Result Date: 06/07/2023  Lower Venous Reflux Study Patient Name:  Michele Arias  Date of Exam:   06/07/2023 Medical Rec #: [id number]       Accession #:    [id number] Date of Birth: January 28, 1986       Patient Gender: F Patient Age:   37 years Exam Location:  [facility] Procedure:      VAS [place] LOWER EXTREMITY VENOUS REFLUX Referring Phys: Coral Else --------------------------------------------------------------------------------  Indications: Pelvic congestion.  Performing Technologist: Thereasa Parkin RVT  Examination Guidelines: A complete evaluation includes B-mode imaging, spectral Doppler, color Doppler, and power Doppler as needed of all accessible portions of each vessel. Bilateral testing is considered an integral part of a complete examination. Limited examinations for reoccurring indications may be performed as noted. The reflux portion of the exam is performed with the patient in reverse Trendelenburg. Significant venous reflux is defined as >500 ms in the superficial venous system, and >1 second in the deep venous system.  Venous Reflux Times +--------------+---------+------+-----------+------------+--------+ RIGHT         Reflux NoRefluxReflux TimeDiameter cmsComments                         Yes                                  +--------------+---------+------+-----------+------------+--------+ CFV                     yes   >1 second                      +--------------+---------+------+-----------+------------+--------+ FV prox       no                                             +--------------+---------+------+-----------+------------+--------+ FV mid        no                                              +--------------+---------+------+-----------+------------+--------+  FV dist                 yes   >1 second                      +--------------+---------+------+-----------+------------+--------+ Popliteal     no                                             +--------------+---------+------+-----------+------------+--------+ GSV at [facility]    no                           0.799             +--------------+---------+------+-----------+------------+--------+ GSV prox thighno                            0.28             +--------------+---------+------+-----------+------------+--------+ GSV mid thigh no                            0.21             +--------------+---------+------+-----------+------------+--------+ GSV dist thighno                           0.219             +--------------+---------+------+-----------+------------+--------+ GSV at knee   no                           0.284             +--------------+---------+------+-----------+------------+--------+ GSV prox calf                              0.187             +--------------+---------+------+-----------+------------+--------+ SSV Pop Fossa no                           0.251             +--------------+---------+------+-----------+------------+--------+ SSV prox calf no                            0.21             +--------------+---------+------+-----------+------------+--------+ SSV mid calf  no                           0.155             +--------------+---------+------+-----------+------------+--------+  +--------------+---------+------+-----------+------------+--------+ LEFT          Reflux NoRefluxReflux TimeDiameter cmsComments                         Yes                                  +--------------+---------+------+-----------+------------+--------+ CFV                     yes   >  1 second                       +--------------+---------+------+-----------+------------+--------+ FV prox       no                                             +--------------+---------+------+-----------+------------+--------+ FV mid        no                                             +--------------+---------+------+-----------+------------+--------+ FV dist       no                                             +--------------+---------+------+-----------+------------+--------+ Popliteal     no                                             +--------------+---------+------+-----------+------------+--------+ GSV at SFJ              yes    >500 ms     0.627             +--------------+---------+------+-----------+------------+--------+ GSV prox thighno                            0.31             +--------------+---------+------+-----------+------------+--------+ GSV mid thigh no                           0.243             +--------------+---------+------+-----------+------------+--------+ GSV dist thighno                           0.466             +--------------+---------+------+-----------+------------+--------+ GSV at knee   no                           0.357             +--------------+---------+------+-----------+------------+--------+ GSV prox calf no                           0.237             +--------------+---------+------+-----------+------------+--------+ SSV Pop Fossa no                           0.142             +--------------+---------+------+-----------+------------+--------+ SSV prox calf no                           0.147             +--------------+---------+------+-----------+------------+--------+ SSV mid calf  no  0.265             +--------------+---------+------+-----------+------------+--------+   Summary: Right: - No evidence of deep vein thrombosis seen in the right lower extremity, from the common  femoral through the popliteal veins. - No evidence of superficial venous thrombosis in the right lower extremity. - Deep vein reflux in the CFV and FV distal. - No evidence of superficial vein reflux.  Left: - No evidence of deep vein thrombosis seen in the left lower extremity, from the common femoral through the popliteal veins. - No evidence of superficial venous thrombosis in the left lower extremity. - Deep vein reflux in the CFV. - Superficial vein reflux in the SFJ.  *See table(s) above for measurements and observations. Electronically signed by Coral Else MD on 06/07/2023 at 2:50:39 PM.    Final     Labs:  CBC: Recent Labs    11/23/22 0945 03/29/23 1031  WBC 4.0 3.6*  HGB 11.8* 10.9*  HCT 35.2* 32.0*  PLT 290.0 282.0    COAGS: No results for input(s): "INR", "APTT" in the last 8760 hours.  BMP: Recent Labs    11/23/22 0945 03/29/23 1031  NA 139 138  K 4.0 3.6  CL 105 106  CO2 27 26  GLUCOSE 93 73  BUN 16 10  CALCIUM 9.1 8.9  CREATININE 0.88 0.72    LIVER FUNCTION TESTS: Recent Labs    11/23/22 0945 03/29/23 1031  BILITOT 0.5 0.7  AST 13 15  ALT 12 11  ALKPHOS 40 36*  PROT 7.0 7.0  ALBUMIN 4.1 4.1    TUMOR MARKERS: No results for input(s): "AFPTM", "CEA", "CA199", "CHROMGRNA" in the last 8760 hours.  Assessment and Plan:  Very pleasant 38 year old female with probable pelvic venous insufficiency.  She has evidence of dilated and refluxing bilateral ovarian veins on prior CT imaging as well as symptoms of pelvic pain, heaviness, pressure, dyspareunia and bilateral lower extremity pain which is worse after long periods of standing and activity.  Her symptoms are quite severe and affecting her quality of life.  I believe she is an optimal candidate for diagnostic venography to fully confirm ovarian venous insufficiency.  If the ovarian veins are found to be incompetent and refluxing, we would then proceed with embolization of the incompetent ovarian  veins.  1.)  Please schedule for bilateral ovarian vein venography and possible embolization to be performed at [facility].  Thank you for this interesting consult.  I greatly enjoyed meeting Michele Arias and look forward to participating in their care.  A copy of this report was sent to the requesting provider on this date.  Electronically Signed: Sterling Big 06/24/2023, 12:26 PM   I spent a total of 60 Minutes  in face to face in clinical consultation, greater than 50% of which was counseling/coordinating care for pelvic venous insufficiency

## 2023-07-26 NOTE — Progress Notes (Unsigned)
 [facility] Initial Consultation   Referring Provider Allwardt, Crist Infante, PA-C [address] [place],  [place] [address]  Primary Care Provider Allwardt, Crist Infante, PA-C  Patient Profile: Michele Arias is a 38 y.o. female who is seen in consultation in the [facility] at the request of Dr. Doloris Hall for evaluation and management of the problem(s) noted below.  Problem List: Generalized abdominal pain CT findings suggestive of pelvic congestion syndrome Colonic diverticulosis IDA   History of Present Illness   Michele Arias is a 38 y.o. female with a history of anxiety, vitamin D deficiency.   Last colonoscopy: *** Last endoscopy: ***  Last Abd CT/CTE/MRE: ***  GI Review of Symptoms Significant for {GIROS:50592}. Otherwise negative.  General Review of Systems  Review of systems is significant for the pertinent positives and negatives as listed per the HPI.  Full ROS is otherwise negative.  Past Medical History   Past Medical History:  Diagnosis Date   Anxiety    Depression    Medical history non-contributory    UTI (urinary tract infection)      Past Surgical History   Past Surgical History:  Procedure Laterality Date   IR RADIOLOGIST EVAL & MGMT  06/24/2023   NO PAST SURGERIES       Allergies and Medications  No Known Allergies  @MEDSTODAY @  Family History   Family History  Problem Relation Age of Onset   Alcohol abuse Mother    Depression Mother    Drug abuse Mother    Hypertension Mother    Mental illness Mother    Bipolar disorder Mother    Alcohol abuse Father    Drug abuse Sister    Mental illness Sister    Miscarriages / [place] Sister    Breast cancer Maternal Grandmother        ? age of diagnosis   Hypertension Maternal Grandmother    Stroke Maternal Grandmother    Cancer Paternal Grandmother    Alcohol abuse Brother    Depression Brother    Mental illness Brother    Alcohol abuse Brother    Drug  abuse Brother      Social History   Social History   Tobacco Use   Smoking status: Never   Smokeless tobacco: Never  Vaping Use   Vaping status: Never Used  Substance Use Topics   Alcohol use: Yes    Comment: rarely   Drug use: No   Elisia reports that she has never smoked. She has never used smokeless tobacco. She reports current alcohol use. She reports that she does not use drugs.  Vital Signs and Physical Examination  There were no vitals filed for this visit. There is no height or weight on file to calculate BMI.    General: Well developed, well nourished, no acute distress Head: Normocephalic and atraumatic Eyes: Sclerae anicteric, EOMI Ears: Normal auditory acuity Mouth: No deformities or lesions noted Lungs: Clear throughout to auscultation Heart: Regular rate and rhythm; No murmurs, rubs or bruits Abdomen: Soft, non tender and non distended. No masses, hepatosplenomegaly or hernias noted. Normal Bowel sounds Rectal: Musculoskeletal: Symmetrical with no gross deformities  Pulses:  Normal pulses noted Extremities: No edema or deformities noted Neurological: Alert oriented x 4, grossly nonfocal Psychological:  Alert and cooperative. Normal mood and affect  Review of Data  The following data was reviewed at the time of this encounter:  Laboratory Studies      Latest Ref Rng & Units 03/29/2023  10:31 AM 11/23/2022    9:45 AM 07/10/2021    8:05 AM  CBC  WBC 4.0 - 10.5 K/uL 3.6  4.0  3.4   Hemoglobin 12.0 - 15.0 g/dL 10.9  32.3  55.7   Hematocrit 36.0 - 46.0 % 32.0  35.2  36.8   Platelets 150.0 - 400.0 K/uL 282.0  290.0  253.0     Lab Results  Component Value Date   LIPASE 26 09/06/2013      Latest Ref Rng & Units 03/29/2023   10:31 AM 11/23/2022    9:45 AM 07/10/2021    8:05 AM  CMP  Glucose 70 - 99 mg/dL 73  93  89   BUN 6 - 23 mg/dL 10  16  14    Creatinine 0.40 - 1.20 mg/dL 3.22  0.25  4.27   Sodium 135 - 145 mEq/L 138  139  139   Potassium 3.5  - 5.1 mEq/L 3.6  4.0  3.9   Chloride 96 - 112 mEq/L 106  105  105   CO2 19 - 32 mEq/L 26  27  27    Calcium 8.4 - 10.5 mg/dL 8.9  9.1  9.3   Total Protein 6.0 - 8.3 g/dL 7.0  7.0  7.1   Total Bilirubin 0.2 - 1.2 mg/dL 0.7  0.5  0.5   Alkaline Phos 39 - 117 U/L 36  40  42   AST 0 - 37 U/L 15  13  11    ALT 0 - 35 U/L 11  12  10       Imaging Studies  CTAP 02/2023 1. No acute abnormality in the abdomen or pelvis. 2. Colonic diverticulosis without findings of acute diverticulitis. 3. Dilated gonadal veins and pelvic collateral vessels, which can be seen in the setting of pelvic congestion syndrome. 4. Scattered sub/pericentimeter bilobar hepatic lesions are technically too small to accurately characterize but statistically likely to reflect benign cysts or hemangiomata.    GI Procedures and Studies  None    Clinical Impression  It is my clinical impression that Michele Arias is a 38 y.o. female with;  ***  Plan  *** *** *** *** ***  Planned Follow Up No follow-ups on file.  The patient or caregiver verbalized understanding of the material covered, with no barriers to understanding. All questions were answered. Patient or caregiver is agreeable with the plan outlined above.    It was a pleasure to see Michele Arias.  If you have any questions or concerns regarding this evaluation, do not hesitate to contact me.  Maren Beach, MD [facility]

## 2023-07-27 ENCOUNTER — Ambulatory Visit: Payer: 59 | Admitting: Pediatrics

## 2023-07-27 ENCOUNTER — Encounter: Payer: Self-pay | Admitting: Pediatrics

## 2023-07-27 VITALS — BP 112/68 | HR 80 | Ht 66.0 in | Wt 130.0 lb

## 2023-07-27 DIAGNOSIS — R933 Abnormal findings on diagnostic imaging of other parts of digestive tract: Secondary | ICD-10-CM

## 2023-07-27 DIAGNOSIS — D649 Anemia, unspecified: Secondary | ICD-10-CM

## 2023-07-27 DIAGNOSIS — D509 Iron deficiency anemia, unspecified: Secondary | ICD-10-CM | POA: Diagnosis not present

## 2023-07-27 DIAGNOSIS — K5730 Diverticulosis of large intestine without perforation or abscess without bleeding: Secondary | ICD-10-CM | POA: Diagnosis not present

## 2023-07-27 DIAGNOSIS — R194 Change in bowel habit: Secondary | ICD-10-CM

## 2023-07-27 DIAGNOSIS — K5900 Constipation, unspecified: Secondary | ICD-10-CM | POA: Diagnosis not present

## 2023-07-27 DIAGNOSIS — R109 Unspecified abdominal pain: Secondary | ICD-10-CM

## 2023-07-27 DIAGNOSIS — R1084 Generalized abdominal pain: Secondary | ICD-10-CM

## 2023-07-27 MED ORDER — NA SULFATE-K SULFATE-MG SULF 17.5-3.13-1.6 GM/177ML PO SOLN: 1.0000 | Freq: Once | ORAL | 0 refills | Status: AC

## 2023-07-27 NOTE — Patient Instructions (Signed)
 You have been scheduled for a colonoscopy. Please follow written instructions given to you at your visit today.   If you use inhalers (even only as needed), please bring them with you on the day of your procedure.  DO NOT TAKE 7 DAYS PRIOR TO TEST- Trulicity (dulaglutide) Ozempic, Wegovy (semaglutide) Mounjaro (tirzepatide) Bydureon Bcise (exanatide extended release)  DO NOT TAKE 1 DAY PRIOR TO YOUR TEST Rybelsus (semaglutide) Adlyxin (lixisenatide) Victoza (liraglutide) Byetta (exanatide)   Thank you for entrusting me with your care and for choosing [facility], Dr. Maren Beach   _______________________________________________________  If your blood pressure at your visit was 140/90 or greater, please contact your primary care physician to follow up on this.  _______________________________________________________  If you are age 48 or older, your body mass index should be between 23-30. Your Body mass index is 20.98 kg/m. If this is out of the aforementioned range listed, please consider follow up with your Primary Care Provider.  If you are age 31 or younger, your body mass index should be between 19-25. Your Body mass index is 20.98 kg/m. If this is out of the aformentioned range listed, please consider follow up with your Primary Care Provider.   ________________________________________________________  The [facility] providers would like to encourage you to use [facility] to communicate with providers for non-urgent requests or questions.  Due to long hold times on the telephone, sending your provider a message by [facility] may be a faster and more efficient way to get a response.  Please allow 48 business hours for a response.  Please remember that this is for non-urgent requests.  _______________________________________________________

## 2023-07-30 ENCOUNTER — Telehealth: Payer: Self-pay

## 2023-07-30 ENCOUNTER — Other Ambulatory Visit: Payer: Self-pay | Admitting: Interventional Radiology

## 2023-07-30 DIAGNOSIS — Z01818 Encounter for other preprocedural examination: Secondary | ICD-10-CM

## 2023-07-30 NOTE — H&P (Signed)
 Chief Complaint: Patient was seen in consultation today for pelvic insufficiency and lower abdominal pain, with consideration for bilateral ovarian venogram and possible embolization.  Referring Provider(s): Dr. Charlena Cross, MD.   Supervising Physician: Malachy Moan  Patient Status: [name] - Out-pt  Patient is Full Code  History of Present Illness: Michele Arias is a 38 y.o. female with PMHx notable for lower abdominal pain, UTIs, anxiety, and depression.  Michele Arias is followed by Dr. Myra Gianotti for lower abdominal pain. He referred patient to Dr. Archer Asa for evaluation and possible IR intervention.   Per Dr. Henri Medal consult note on 2/27: "1 year history of chronic pelvic and bilateral lower extremity pain.  She describes her symptoms as intermittent shooting pains from her vagina into her lower abdomen.  Additionally, she has symptoms of pelvic heaviness which extends into her thighs bilaterally.  She also confirms significant dyspareunia. [...]   Her symptoms are not present when she first wakes up in the morning.  However, the longer she spends time sitting up or on her feet the more intense her symptoms become.  She is finding it difficult to walk or exercise.  In particular, the dyspareunia is very distressing to both her and her husband."  Interventional Radiology was requested for bilateral ovarian venogram with possible embolization. Request was reviewed and approved by Dr. Archer Asa. Patient is scheduled for same in IR today.  All labs and medications are within acceptable parameters. No pertinent allergies. Patient has been NPO since midnight.    Patient is currently without any significant complaints. Patient is alert and laying in bed, calm. Patient denies any fevers, headache, chest pain, SOB, cough, abdominal pain, nausea, vomiting or bleeding.     Past Medical History:  Diagnosis Date   Anxiety    Depression    Medical history non-contributory     Pelvic congestion    UTI (urinary tract infection)     Past Surgical History:  Procedure Laterality Date   IR RADIOLOGIST EVAL & MGMT  06/24/2023   NO PAST SURGERIES      Allergies: Patient has no known allergies.  Medications: Prior to Admission medications   Not on File     Family History  Problem Relation Age of Onset   Alcohol abuse Mother    Depression Mother    Drug abuse Mother    Hypertension Mother    Mental illness Mother    Bipolar disorder Mother    Alcohol abuse Father    Drug abuse Sister    Mental illness Sister    Miscarriages / [place] Sister    Alcohol abuse Brother    Depression Brother    Mental illness Brother    Alcohol abuse Brother    Drug abuse Brother    Breast cancer Maternal Grandmother        ? age of diagnosis   Hypertension Maternal Grandmother    Stroke Maternal Grandmother    Cancer Paternal Grandmother    Colon cancer Maternal Uncle     Social History   Socioeconomic History   Marital status: Married    Spouse name: Not on file   Number of children: Not on file   Years of education: Not on file   Highest education level: Not on file  Occupational History   Not on file  Tobacco Use   Smoking status: Never   Smokeless tobacco: Never  Vaping Use   Vaping status: Never Used  Substance and Sexual Activity  Alcohol use: Yes    Comment: rarely   Drug use: No   Sexual activity: Not Currently    Partners: Male    Birth control/protection: Surgical    Comment: husband-vasectomy  Other Topics Concern   Not on file  Social History Narrative   Left handed   Lives with husband and to children in a two story   Caffeine rarely   Social Drivers of Corporate investment banker Strain: Not on file  Food Insecurity: Not on file  Transportation Needs: Not on file  Physical Activity: Not on file  Stress: Not on file  Social Connections: Not on file    Review of Systems: A 12 point ROS discussed and pertinent  positives are indicated in the HPI above.  All other systems are negative.  Vital Signs: BP 121/88   Pulse (!) 106   Temp 97.7 F (36.5 C) (Oral)   Resp 14   Ht 5\' 7"  (1.702 m)   Wt 129 lb (58.5 kg)   LMP 08/02/2023 Comment: pt reports no chance she is pregnant and wishes to sign pregnancy waiver  SpO2 100%   BMI 20.20 kg/m   Advance Care Plan: The advanced care place/surrogate decision maker was discussed at the time of visit and the patient did not wish to discuss or was not able to name a surrogate decision maker or provide an advance care plan.  Physical Exam Constitutional:      General: She is not in acute distress.    Appearance: Normal appearance.  HENT:     Mouth/Throat:     Mouth: Mucous membranes are moist.  Cardiovascular:     Rate and Rhythm: Normal rate and regular rhythm.     Heart sounds: No murmur heard. Pulmonary:     Effort: Pulmonary effort is normal.     Breath sounds: Normal breath sounds. No wheezing.  Musculoskeletal:        General: Normal range of motion.  Skin:    General: Skin is warm and dry.  Neurological:     Mental Status: She is alert and oriented to person, place, and time.  Psychiatric:        Mood and Affect: Mood normal.        Behavior: Behavior normal.        Thought Content: Thought content normal.        Judgment: Judgment normal.     Imaging: No results found.  Labs:  CBC: Recent Labs    11/23/22 0945 03/29/23 1031 08/02/23 0805  WBC 4.0 3.6* 5.2  HGB 11.8* 10.9* 11.9*  HCT 35.2* 32.0* 35.7*  PLT 290.0 282.0 300    COAGS: Recent Labs    08/02/23 0805  INR 1.0    BMP: Recent Labs    11/23/22 0945 03/29/23 1031 08/02/23 0805  NA 139 138 139  K 4.0 3.6 3.4*  CL 105 106 106  CO2 27 26 24   GLUCOSE 93 73 94  BUN 16 10 15   CALCIUM 9.1 8.9 9.0  CREATININE 0.88 0.72 0.67  GFRNONAA  --   --  >60    LIVER FUNCTION TESTS: Recent Labs    11/23/22 0945 03/29/23 1031  BILITOT 0.5 0.7  AST 13 15  ALT  12 11  ALKPHOS 40 36*  PROT 7.0 7.0  ALBUMIN 4.1 4.1    TUMOR MARKERS: No results for input(s): "AFPTM", "CEA", "CA199", "CHROMGRNA" in the last 8760 hours.  Assessment and Plan:  Per Dr. Henri Medal  consult note on 2/27: "Very pleasant 38 year old female with probable pelvic venous insufficiency.  She has evidence of dilated and refluxing bilateral ovarian veins on prior CT imaging as well as symptoms of pelvic pain, heaviness, pressure, dyspareunia and bilateral lower extremity pain which is worse after long periods of standing and activity.  Her symptoms are quite severe and affecting her quality of life.   I believe she is an optimal candidate for diagnostic venography to fully confirm ovarian venous insufficiency.  If the ovarian veins are found to be incompetent and refluxing, we would then proceed with embolization of the incompetent ovarian veins."  Patient presents for scheduled bilateral ovarian venogram and possible embolization in IR today.    Risks and benefits of bilateral ovarian venogram and possible embolization  were discussed with the patient including, but not limited to bleeding, infection, vascular injury or contrast induced renal failure.  This interventional procedure involves the use of X-rays and because of the nature of the planned procedure, it is possible that we will have prolonged use of X-ray fluoroscopy.  Potential radiation risks to you include (but are not limited to) the following: - A slightly elevated risk for cancer several years later in life. This risk is typically less than 0.5% percent. This risk is low in comparison to the normal incidence of human cancer, which is 33% for women and 50% for men according to the American Cancer Society. - Radiation induced injury can include skin redness, resembling a rash, tissue breakdown / ulcers and hair loss (which can be temporary or permanent).   The likelihood of either of these occurring depends on the  difficulty of the procedure and whether you are sensitive to radiation due to previous procedures, disease, or genetic conditions.   IF your procedure requires a prolonged use of radiation, you will be notified and given written instructions for further action.  It is your responsibility to monitor the irradiated area for the 2 weeks following the procedure and to notify your physician if you are concerned that you have suffered a radiation induced injury.    All of the patient's questions were answered, patient is agreeable to proceed.  Consent signed and in chart.      Thank you for allowing our service to participate in Indiya Izquierdo 's care.  Electronically Signed: Sable Feil, PA-C   08/02/2023, 8:34 AM      I spent a total of 25 Minutes in face to face in clinical consultation, greater than 50% of which was counseling/coordinating care for pelvic insufficiency and lower abdominal pain, with consideration for bilateral ovarian venogram and possible embolization.

## 2023-07-30 NOTE — Progress Notes (Signed)
 Patient for IR Bil Ovarian vein venography & poss embolization on Monday 08/02/23, I called and spoke with the patient on the phone and gave pre-procedure instructions. Pt was made aware to be here at 7:30a, NPO after MN prior to procedure as well as driver post procedure/recovery/discharge. Pt stated understanding.  Called 06/30/23 and 07/30/23

## 2023-07-30 NOTE — Telephone Encounter (Signed)
 Updated instructions mailed and sent to patient via MyChart. MyChart message sent to patient with update.

## 2023-07-30 NOTE — Telephone Encounter (Signed)
-----   Message from Ottie Glazier sent at 07/29/2023 10:24 PM EDT ----- Regarding: 2-day bowel prep Hi Nurses -  I saw Ms. Cerros in the office this week and scheduled her for an EGD and colonoscopy.  She reported symptoms of constipation and I think I only gave her a 1 day bowel prep.  I think she should probably have a 2-day bowel prep given her symptoms of constipation.  Can you please contact her and adjust bowel prep to a 2-day regimen?  Thanks,  Harriett Sine

## 2023-08-02 ENCOUNTER — Other Ambulatory Visit: Payer: Self-pay

## 2023-08-02 ENCOUNTER — Ambulatory Visit
Admission: RE | Admit: 2023-08-02 | Discharge: 2023-08-02 | Disposition: A | Discharge status: Home / Self Care | Source: Ambulatory Visit | Attending: Interventional Radiology | Admitting: Interventional Radiology

## 2023-08-02 ENCOUNTER — Other Ambulatory Visit (HOSPITAL_COMMUNITY): Payer: Self-pay

## 2023-08-02 ENCOUNTER — Encounter: Payer: Self-pay | Admitting: Radiology

## 2023-08-02 DIAGNOSIS — F32A Depression, unspecified: Secondary | ICD-10-CM | POA: Insufficient documentation

## 2023-08-02 DIAGNOSIS — N9489 Other specified conditions associated with female genital organs and menstrual cycle: Secondary | ICD-10-CM | POA: Insufficient documentation

## 2023-08-02 DIAGNOSIS — Z01818 Encounter for other preprocedural examination: Secondary | ICD-10-CM

## 2023-08-02 DIAGNOSIS — N941 Unspecified dyspareunia: Secondary | ICD-10-CM | POA: Insufficient documentation

## 2023-08-02 DIAGNOSIS — F419 Anxiety disorder, unspecified: Secondary | ICD-10-CM | POA: Insufficient documentation

## 2023-08-02 HISTORY — DX: Other specified conditions associated with female genital organs and menstrual cycle: N94.89

## 2023-08-02 HISTORY — PX: IR EMBO VENOUS NOT HEMORR HEMANG  INC GUIDE ROADMAPPING: IMG5447

## 2023-08-02 LAB — BASIC METABOLIC PANEL WITH GFR
Anion gap: 9 (ref 5–15)
BUN: 15 mg/dL (ref 6–20)
CO2: 24 mmol/L (ref 22–32)
Calcium: 9 mg/dL (ref 8.9–10.3)
Chloride: 106 mmol/L (ref 98–111)
Creatinine, Ser: 0.67 mg/dL (ref 0.44–1.00)
GFR, Estimated: >60 mL/min (ref >60)
Glucose, Bld: 94 mg/dL (ref 70–99)
Potassium: 3.4 mmol/L — ABNORMAL LOW (ref 3.5–5.1)
Sodium: 139 mmol/L (ref 135–145)

## 2023-08-02 LAB — CBC
HCT: 35.7 % — ABNORMAL LOW (ref 36.0–46.0)
Hemoglobin: 11.9 g/dL — ABNORMAL LOW (ref 12.0–15.0)
MCH: 29.2 pg (ref 26.0–34.0)
MCHC: 33.3 g/dL (ref 30.0–36.0)
MCV: 87.7 fL (ref 80.0–100.0)
Platelets: 300 10*3/uL (ref 150–400)
RBC: 4.07 MIL/uL (ref 3.87–5.11)
RDW: 13.2 % (ref 11.5–15.5)
WBC: 5.2 10*3/uL (ref 4.0–10.5)
nRBC: 0 % (ref 0.0–0.2)

## 2023-08-02 LAB — PROTIME-INR
INR: 1 (ref 0.8–1.2)
Prothrombin Time: 13.1 s (ref 11.4–15.2)

## 2023-08-02 MED ORDER — SODIUM CHLORIDE 0.9 % IV SOLN
8.0000 mg | Freq: Once | INTRAVENOUS | Status: AC
  Administered 2023-08-02: 8 mg via INTRAVENOUS
  Filled 2023-08-02: qty 4

## 2023-08-02 MED ORDER — LIDOCAINE HCL 1 % IJ SOLN
INTRAMUSCULAR | Status: AC
  Filled 2023-08-02: qty 20

## 2023-08-02 MED ORDER — DIPHENHYDRAMINE HCL 50 MG/ML IJ SOLN
INTRAMUSCULAR | Status: AC | PRN
  Administered 2023-08-02 (×2): 25 mg via INTRAVENOUS

## 2023-08-02 MED ORDER — SODIUM TETRADECYL SULFATE 3 % IV SOLN
2.0000 mL | Freq: Once | INTRAVENOUS | Status: AC
  Administered 2023-08-02: 60 mg via INTRAVENOUS
  Filled 2023-08-02: qty 2

## 2023-08-02 MED ORDER — LIPIODOL ULTRAFLUID INJECTION
1.0000 mL | Freq: Once | INTRAMUSCULAR | Status: AC
  Administered 2023-08-02: 1 mL

## 2023-08-02 MED ORDER — MIDAZOLAM HCL 5 MG/5ML IJ SOLN
INTRAMUSCULAR | Status: AC | PRN
  Administered 2023-08-02: .5 mg via INTRAVENOUS

## 2023-08-02 MED ORDER — KETOROLAC TROMETHAMINE 60 MG/2ML IM SOLN
INTRAMUSCULAR | Status: AC
  Filled 2023-08-02: qty 2

## 2023-08-02 MED ORDER — FENTANYL CITRATE (PF) 100 MCG/2ML IJ SOLN
INTRAMUSCULAR | Status: AC | PRN
  Administered 2023-08-02: 50 ug via INTRAVENOUS
  Administered 2023-08-02 (×2): 25 ug via INTRAVENOUS

## 2023-08-02 MED ORDER — KETOROLAC TROMETHAMINE 30 MG/ML IJ SOLN
30.0000 mg | Freq: Once | INTRAMUSCULAR | Status: AC
  Administered 2023-08-02: 30 mg via INTRAVENOUS
  Filled 2023-08-02: qty 1

## 2023-08-02 MED ORDER — DEXAMETHASONE SODIUM PHOSPHATE 10 MG/ML IJ SOLN
10.0000 mg | Freq: Once | INTRAMUSCULAR | Status: AC
  Administered 2023-08-02: 10 mg via INTRAVENOUS
  Filled 2023-08-02: qty 1

## 2023-08-02 MED ORDER — CEFAZOLIN SODIUM-DEXTROSE 2-4 GM/100ML-% IV SOLN
INTRAVENOUS | Status: AC | PRN
  Administered 2023-08-02: 2 g via INTRAVENOUS

## 2023-08-02 MED ORDER — CEFAZOLIN SODIUM-DEXTROSE 2-4 GM/100ML-% IV SOLN
INTRAVENOUS | Status: AC
  Filled 2023-08-02: qty 100

## 2023-08-02 MED ORDER — IOHEXOL 300 MG/ML  SOLN
140.0000 mL | Freq: Once | INTRAMUSCULAR | Status: AC | PRN
  Administered 2023-08-02: 140 mL via INTRAVENOUS

## 2023-08-02 MED ORDER — FENTANYL CITRATE (PF) 100 MCG/2ML IJ SOLN
INTRAMUSCULAR | Status: AC
  Filled 2023-08-02: qty 2

## 2023-08-02 MED ORDER — OXYCODONE-ACETAMINOPHEN 5-325 MG PO TABS
1.0000 | ORAL_TABLET | Freq: Four times a day (QID) | ORAL | 0 refills | Status: DC | PRN
  Filled 2023-08-02: qty 25, 7d supply, fill #0

## 2023-08-02 MED ORDER — POTASSIUM CHLORIDE IN NACL 20-0.9 MEQ/L-% IV SOLN
INTRAVENOUS | Status: DC
Start: 2023-08-02 — End: 2023-08-02

## 2023-08-02 MED ORDER — ONDANSETRON 4 MG PO TBDP: 4.0000 mg | ORAL_TABLET | Freq: Three times a day (TID) | ORAL | 0 refills | Status: DC | PRN

## 2023-08-02 MED ORDER — MIDAZOLAM HCL 2 MG/2ML IJ SOLN
INTRAMUSCULAR | Status: AC | PRN
  Administered 2023-08-02: .5 mg via INTRAVENOUS
  Administered 2023-08-02: 1 mg via INTRAVENOUS

## 2023-08-02 MED ORDER — DIPHENHYDRAMINE HCL 50 MG/ML IJ SOLN
INTRAMUSCULAR | Status: AC
  Filled 2023-08-02: qty 1

## 2023-08-02 MED ORDER — LIDOCAINE HCL 1 % IJ SOLN
2.0000 mL | Freq: Once | INTRAMUSCULAR | Status: AC
  Administered 2023-08-02: 2 mL via INTRADERMAL

## 2023-08-02 MED ORDER — SODIUM CHLORIDE 0.9 % IV SOLN: INTRAVENOUS | Status: DC

## 2023-08-02 MED ORDER — METHYLPREDNISOLONE 4 MG PO TBPK: ORAL_TABLET | ORAL | 0 refills | Status: DC

## 2023-08-02 MED ORDER — OXYCODONE-ACETAMINOPHEN 5-325 MG PO TABS: 1.0000 | ORAL_TABLET | Freq: Four times a day (QID) | ORAL | 0 refills | Status: DC | PRN

## 2023-08-02 MED ORDER — MIDAZOLAM HCL 2 MG/2ML IJ SOLN
INTRAMUSCULAR | Status: AC
  Filled 2023-08-02: qty 2

## 2023-08-02 MED ORDER — CEFAZOLIN SODIUM-DEXTROSE 2-4 GM/100ML-% IV SOLN: 2.0000 g | INTRAVENOUS | Status: DC

## 2023-08-02 NOTE — Discharge Instructions (Signed)
 Hold Aleve or advil (ibuprofen) until after 6 pm tonight as needed. Take percocet as needed you don't need to wait to take, take when pain starts increasing. Don't wait for pain to get bad to start medications. It makes pain control harder to manage if you delay starting pain medications. Remove band aid tomorrow. No soaking in pool or hot tub one week or when fully scabbed over.

## 2023-08-02 NOTE — Procedures (Signed)
 Interventional Radiology Procedure Note  Procedure: Bilateral ovarian venography and embolization.  Complications: None  Estimated Blood Loss: None  Recommendations: - Bedrest x 2 hrs - DC home - FU in 2 weeks  Signed,  Sterling Big, MD

## 2023-08-02 NOTE — Progress Notes (Signed)
 Patient clinically stable post IR uterine embolization per Dr Archer Asa , tolerated well. Patient vitals stable pre and post procedure. Received Versed 2 mg along with Fentanyl 100 mcg IV for procedure. Report given to Orlinda Blalock RN post procedure/specials/1

## 2023-08-05 DIAGNOSIS — R102 Pelvic and perineal pain: Secondary | ICD-10-CM | POA: Diagnosis not present

## 2023-08-05 DIAGNOSIS — N939 Abnormal uterine and vaginal bleeding, unspecified: Secondary | ICD-10-CM | POA: Diagnosis not present

## 2023-08-05 DIAGNOSIS — N920 Excessive and frequent menstruation with regular cycle: Secondary | ICD-10-CM | POA: Diagnosis not present

## 2023-08-06 ENCOUNTER — Other Ambulatory Visit: Payer: Self-pay | Admitting: Interventional Radiology

## 2023-08-06 DIAGNOSIS — N9489 Other specified conditions associated with female genital organs and menstrual cycle: Secondary | ICD-10-CM

## 2023-08-09 ENCOUNTER — Encounter: Payer: Self-pay | Admitting: Interventional Radiology

## 2023-08-13 ENCOUNTER — Ambulatory Visit
Admission: RE | Admit: 2023-08-13 | Discharge: 2023-08-13 | Disposition: A | Discharge status: Home / Self Care | Source: Ambulatory Visit | Attending: Interventional Radiology | Admitting: Interventional Radiology

## 2023-08-13 DIAGNOSIS — N9489 Other specified conditions associated with female genital organs and menstrual cycle: Secondary | ICD-10-CM | POA: Diagnosis not present

## 2023-08-13 DIAGNOSIS — N941 Unspecified dyspareunia: Secondary | ICD-10-CM | POA: Diagnosis not present

## 2023-08-13 HISTORY — PX: IR RADIOLOGIST EVAL & MGMT: IMG5224

## 2023-08-13 NOTE — Progress Notes (Signed)
 This encounter was conducted via the [employer] providing interactive audio and visual communication.  The patient provided verbal consent to conduct a virtual appointment.  The patient was located at their primary residence during this encounter.   Chief Complaint: Patient was seen in consultation today for bilateral ovarian venous insufficiency, chronic pelvic pain at the request of Axavier Pressley K  Referring Physician(s): Tayanna Talford K  History of Present Illness: Michele Arias is a 38 y.o. female with a history of symptoms highly concerning for bilateral ovarian venous insufficiency and associated chronic pelvic pain.  She underwent bilateral ovarian venography pole on 08/02/2023 which confirmed retrograde flow in both ovarian veins.  The veins were subsequently embolized during same session.  We had a video my chart visit today to assess her status at 2 weeks postprocedure.  [place] the [place] and doing exceptionally well.  She reports that she is return to full activities and her postprocedural course was uneventful.  She notes that her symptoms are markedly improved and she is extremely happy.  Her only complaint is some occasional mild intermittent cramping which is improving.  She has no vaginal discharge, fever, chills, abdominal pain or other issues at this time.   Past Medical History:  Diagnosis Date   Anxiety    Depression    Medical history non-contributory    Pelvic congestion    UTI (urinary tract infection)     Past Surgical History:  Procedure Laterality Date   IR EMBO VENOUS NOT HEMORR HEMANG  INC GUIDE ROADMAPPING  08/02/2023   IR RADIOLOGIST EVAL & MGMT  06/24/2023   NO PAST SURGERIES      Allergies: Patient has no known allergies.  Medications: Prior to Admission medications   Medication Sig Start Date End Date Taking? Authorizing Provider  methylPREDNISolone  (MEDROL  DOSEPAK) 4 MG TBPK tablet Please take 6 pills one day  one, and decrease by one pill every subsequent day, for a total of 21 pills over 6 days. 08/02/23   Matthews, Kacie Sue-Ellen, PA  ondansetron  (ZOFRAN -ODT) 4 MG disintegrating tablet Take 1 tablet (4 mg total) by mouth every 8 (eight) hours as needed for nausea or vomiting. 08/02/23   Matthews, Kacie Sue-Ellen, PA  oxyCODONE -acetaminophen  (PERCOCET) 5-325 MG tablet Take 1 tablet by mouth every 6 (six) hours as needed for up to 25 doses for severe pain (pain score 7-10). 08/02/23   Matthews, Kacie Sue-Ellen, PA  oxyCODONE -acetaminophen  (PERCOCET) 5-325 MG tablet Take 1 tablet by mouth every 6 (six) hours as needed for up to 25 doses for severe pain (pain score 7-10). 08/02/23   Carim, Gordy Lauber, PA-C     Family History  Problem Relation Age of Onset   Alcohol abuse Mother    Depression Mother    Drug abuse Mother    Hypertension Mother    Mental illness Mother    Bipolar disorder Mother    Alcohol abuse Father    Drug abuse Sister    Mental illness Sister    Miscarriages / [place] Sister    Alcohol abuse Brother    Depression Brother    Mental illness Brother    Alcohol abuse Brother    Drug abuse Brother    Breast cancer Maternal Grandmother        ? age of diagnosis   Hypertension Maternal Grandmother    Stroke Maternal Grandmother    Cancer Paternal Grandmother    Colon cancer Maternal Uncle     Social History  Socioeconomic History   Marital status: Married    Spouse name: Not on file   Number of children: Not on file   Years of education: Not on file   Highest education level: Not on file  Occupational History   Not on file  Tobacco Use   Smoking status: Never   Smokeless tobacco: Never  Vaping Use   Vaping status: Never Used  Substance and Sexual Activity   Alcohol use: Yes    Comment: rarely   Drug use: No   Sexual activity: Not Currently    Partners: Male    Birth control/protection: Surgical    Comment: husband-vasectomy  Other Topics Concern   Not on file   Social History Narrative   Left handed   Lives with husband and to children in a two story   Caffeine rarely   Social Drivers of Corporate investment banker Strain: Not on file  Food Insecurity: Not on file  Transportation Needs: Not on file  Physical Activity: Not on file  Stress: Not on file  Social Connections: Not on file    Review of Systems: A 12 point ROS discussed and pertinent positives are indicated in the HPI above.  All other systems are negative.  Review of Systems  Vital Signs: LMP 08/02/2023 Comment: pt reports no chance she is pregnant and wishes to sign pregnancy waiver   Physical Exam Constitutional:      General: She is not in acute distress.    Appearance: Normal appearance. She is normal weight.     Comments: Happy and smiling  HENT:     Head: Normocephalic and atraumatic.  Eyes:     General: No scleral icterus. Pulmonary:     Effort: Pulmonary effort is normal.  Neurological:     Mental Status: She is alert and oriented to person, place, and time.  Psychiatric:        Mood and Affect: Mood normal.        Behavior: Behavior normal.       Imaging: IR EMBO VENOUS NOT HEMORR HEMANG  INC GUIDE ROADMAPPING Result Date: 08/02/2023 INDICATION: 38 year old female with chronic pelvic pain in suspected bilateral ovarian venous insufficiency. EXAM: IR EMBO VENOUS NOT HEMORR HEMANG  INC GUIDE ROADMAPPING COMPARISON:  CT scan of the abdomen and pelvis 03/04/2023 MEDICATIONS: None. ANESTHESIA/SEDATION: Versed  2 mg IV; Fentanyl  100 mcg IV; Benadryl  25 mg IV all administered by the radiology nurse Moderate Sedation Time:  110 minutes The patient's vital signs and level of consciousness were continuously monitored during the procedure by the interventional radiology nurse under my direct supervision. FLUOROSCOPY TIME:  Radiation exposure index: 414 mGy reference air kerma COMPLICATIONS: None immediate. TECHNIQUE: Informed written consent was obtained from the  patient after a thorough discussion of the procedural risks, benefits and alternatives. All questions were addressed. Maximal Sterile Barrier Technique was utilized including caps, mask, sterile gowns, sterile gloves, sterile drape, hand hygiene and skin antiseptic. A timeout was performed prior to the initiation of the procedure. The right internal jugular was interrogated with ultrasound and found to be widely patent. An image was obtained and stored for the medical record. Local anesthesia was attained by infiltration with 1% lidocaine . A small dermatotomy was made. Under real-time sonographic guidance, the vessel was punctured with a 21 gauge micropuncture needle. Using standard technique, the initial micro needle was exchanged over a 0.018 micro wire for a transitional 4 [place] micro sheath. The micro sheath was then exchanged over a 0.035  wire for a 6 French 55 cm cook Ansel sheath. In MPA catheter was advanced coaxially through the sheath and used to select the left renal vein. A left renal venogram was performed. There is obvious reflux down the left ovarian vein. The catheter was navigated into the left ovarian vein origin. Contrast was again injected. 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 Additionally, there are other small branches as the vessel descends into the pelvis. Significant retrograde flow confirming the diagnosis of ovarian venous insufficiency. At this point, the 5 French catheter was exchanged for 8 Progreat microcatheter which was initially advanced down the more medial of the 2 parallel left ovarian veins. Next, a second Progreat microcatheter was advanced coaxially through the 6 French sheath and used to select the more lateral of the 2 parallel ovarian veins. This was also advanced into the pelvis. The distal aspect of both ovarian veins were then coil embolized using 6 and 8 mm penumbra detachable coils. Next, a foam  sclerosis at mixture comprised of 3% Sotradecol  and Lipiodol  was created using our standard 3-2 to 1 ratio of air to Sotradecol  to Lipiodol . Sclerosis and embolization was then performed through 1 of the micro catheters affectively filling all of the small branching vessels. The microcatheter catheters were then brought back up into the more proximal aspects of the 2 parallel ovarian veins and proximal embolization was performed using an MVP 5 in each vessel. An additional detachable penumbra micro coil was placed in the more medial venous branch proximal to the MVP device. Follow-up venography through the sheath and the left renal vein demonstrates no further filling of the duplicated left ovarian veins. Attention was turned to the right ovarian vein. The MVP catheter was reintroduced and successfully used to catheterize the right gonadal vein. Venography was performed. Again, there is incompetent flow down the right ovarian vein. The catheter was advanced distally into the vein and coil embolization performed using 0.35 Azure hydro coils. The 5 French catheter was then brought back into the more proximal aspect of the ovarian vein and an MVP 7 device was deployed. Contrast injection was performed confirming stasis in the right ovarian vein. The catheters and sheaths were removed. Hemostasis was attained with the assistance of manual compression. FINDINGS: Confirmed bilateral ovarian venous insufficiency. 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 IMPRESSION: 1. Confirmation of bilateral ovarian venous insufficiency. 2. Successful embolization of the bilateral ovarian veins. Electronically Signed   By: Fernando Hoyer M.D.   On: 08/02/2023 13:08    Labs:  CBC: Recent Labs    11/23/22 0945 03/29/23 1031 08/02/23 0805  WBC 4.0 3.6* 5.2  HGB 11.8* 10.9* 11.9*  HCT 35.2* 32.0* 35.7*  PLT 290.0 282.0 300    COAGS: Recent Labs    08/02/23 0805  INR 1.0     BMP: Recent Labs    11/23/22 0945 03/29/23 1031 08/02/23 0805  NA 139 138 139  K 4.0 3.6 3.4*  CL 105 106 106  CO2 27 26 24   GLUCOSE 93 73 94  BUN 16 10 15   CALCIUM 9.1 8.9 9.0  CREATININE 0.88 0.72 0.67  GFRNONAA  --   --  >60    LIVER FUNCTION TESTS: Recent Labs    11/23/22 0945 03/29/23 1031  BILITOT 0.5 0.7  AST 13 15  ALT 12 11  ALKPHOS 40 36*  PROT 7.0 7.0  ALBUMIN 4.1 4.1  TUMOR MARKERS: No results for input(s): "AFPTM", "CEA", "CA199", "CHROMGRNA" in the last 8760 hours.  Assessment and Plan:  Very pleasant 38 year old female with bilateral ovarian venous insufficiency in chronic pelvic pain.  She is now 2 weeks status post bilateral ovarian venography and embolization.  She is doing exceptionally well and has essentially fully recovered.  Her symptoms are markedly improved.  She reports that she only has occasional intermittent cramping which she feels like is improving with time.  1.) Next clinic visit in 3 months.     Electronically Signed: Roxie Cord 08/13/2023, 3:00 PM   This encounter was conducted via the [employer] providing interactive audio and visual communication.  The patient provided verbal consent to conduct a virtual appointment.  The patient was located at their primary residence during this encounter.  I spent a total of  15 Minutes in face to face in clinical consultation, greater than 50% of which was counseling/coordinating care for bilateral ovarian venous insufficiency, chronic pelvic pain

## 2023-09-06 NOTE — Progress Notes (Unsigned)
 [facility] History and Physical   Primary Care Physician:  Allwardt, Deleta Felix, PA-C   Reason for Procedure:  Generalized abdominal pain, constipation, IDA  Plan:    Upper endoscopy and colonoscopy     HPI: Michele Arias is a 38 y.o. female undergoing upper endoscopy and colonoscopy for evaluation of generalized abdominal pain, constipation and iron deficiency anemia.  Patient reports a history of abdominal and left-sided flank pain when bending down.  Endorses associated gas and nausea but no vomiting.  Has noted a change in bowel habits over the last year with increasing constipation.  Labs in December 2024 disclosed anemia with hemoglobin 10.9, hematocrit 32, iron 55-ferritin 6.3.  At the time of last clinic visit she reported 2 episodes of bright red blood per rectum but no chronic melena or hematochezia.  Reports a family history of colorectal cancer in a maternal uncle.   Past Medical History:  Diagnosis Date   Anxiety    Depression    Medical history non-contributory    Pelvic congestion    UTI (urinary tract infection)     Past Surgical History:  Procedure Laterality Date   IR EMBO VENOUS NOT HEMORR HEMANG  INC GUIDE ROADMAPPING  08/02/2023   IR RADIOLOGIST EVAL & MGMT  06/24/2023   IR RADIOLOGIST EVAL & MGMT  08/13/2023   NO PAST SURGERIES      Prior to Admission medications   Medication Sig Start Date End Date Taking? Authorizing Provider  methylPREDNISolone  (MEDROL  DOSEPAK) 4 MG TBPK tablet Please take 6 pills one day one, and decrease by one pill every subsequent day, for a total of 21 pills over 6 days. 08/02/23   Matthews, Kacie Sue-Ellen, PA  ondansetron  (ZOFRAN -ODT) 4 MG disintegrating tablet Take 1 tablet (4 mg total) by mouth every 8 (eight) hours as needed for nausea or vomiting. 08/02/23   Matthews, Kacie Sue-Ellen, PA  oxyCODONE -acetaminophen  (PERCOCET) 5-325 MG tablet Take 1 tablet by mouth every 6 (six) hours as needed for up to 25 doses for severe  pain (pain score 7-10). 08/02/23   Matthews, Kacie Sue-Ellen, PA  oxyCODONE -acetaminophen  (PERCOCET) 5-325 MG tablet Take 1 tablet by mouth every 6 (six) hours as needed for up to 25 doses for severe pain (pain score 7-10). 08/02/23   Carim, Gordy Lauber, PA-C    Current Outpatient Medications  Medication Sig Dispense Refill   methylPREDNISolone  (MEDROL  DOSEPAK) 4 MG TBPK tablet Please take 6 pills one day one, and decrease by one pill every subsequent day, for a total of 21 pills over 6 days. 1 each 0   ondansetron  (ZOFRAN -ODT) 4 MG disintegrating tablet Take 1 tablet (4 mg total) by mouth every 8 (eight) hours as needed for nausea or vomiting. 20 tablet 0   oxyCODONE -acetaminophen  (PERCOCET) 5-325 MG tablet Take 1 tablet by mouth every 6 (six) hours as needed for up to 25 doses for severe pain (pain score 7-10). 25 tablet 0   oxyCODONE -acetaminophen  (PERCOCET) 5-325 MG tablet Take 1 tablet by mouth every 6 (six) hours as needed for up to 25 doses for severe pain (pain score 7-10). 25 tablet 0   No current facility-administered medications for this visit.    Allergies as of 09/07/2023   (No Known Allergies)    Family History  Problem Relation Age of Onset   Alcohol abuse Mother    Depression Mother    Drug abuse Mother    Hypertension Mother    Mental illness Mother    Bipolar disorder Mother  Alcohol abuse Father    Drug abuse Sister    Mental illness Sister    Miscarriages / Stillbirths Sister    Alcohol abuse Brother    Depression Brother    Mental illness Brother    Alcohol abuse Brother    Drug abuse Brother    Breast cancer Maternal Grandmother        ? age of diagnosis   Hypertension Maternal Grandmother    Stroke Maternal Grandmother    Cancer Paternal Grandmother    Colon cancer Maternal Uncle     Social History   Socioeconomic History   Marital status: Married    Spouse name: Not on file   Number of children: Not on file   Years of education: Not on file    Highest education level: Not on file  Occupational History   Not on file  Tobacco Use   Smoking status: Never   Smokeless tobacco: Never  Vaping Use   Vaping status: Never Used  Substance and Sexual Activity   Alcohol use: Yes    Comment: rarely   Drug use: No   Sexual activity: Not Currently    Partners: Male    Birth control/protection: Surgical    Comment: husband-vasectomy  Other Topics Concern   Not on file  Social History Narrative   Left handed   Lives with husband and to children in a two story   Caffeine rarely   Social Drivers of Corporate investment banker Strain: Not on file  Food Insecurity: Not on file  Transportation Needs: Not on file  Physical Activity: Not on file  Stress: Not on file  Social Connections: Not on file  Intimate Partner Violence: Not on file    Review of Systems:  All other review of systems negative except as mentioned in the HPI.  Physical Exam: Vital signs There were no vitals taken for this visit.  General:   Alert,  Well-developed, well-nourished, pleasant and cooperative in NAD Airway:  Mallampati  Lungs:  Clear throughout to auscultation.   Heart:  Regular rate and rhythm; no murmurs, clicks, rubs,  or gallops. Abdomen:  Soft, nontender and nondistended. Normal bowel sounds.   Neuro/Psych:  Normal mood and affect. A and O x 3  Eugenia Hess, MD [facility]

## 2023-09-07 ENCOUNTER — Encounter: Payer: Self-pay | Admitting: Pediatrics

## 2023-09-07 ENCOUNTER — Ambulatory Visit (AMBULATORY_SURGERY_CENTER): Admitting: Pediatrics

## 2023-09-07 VITALS — BP 112/63 | HR 84 | Temp 97.8°F | Resp 11 | Ht 66.0 in | Wt 130.0 lb

## 2023-09-07 DIAGNOSIS — K2970 Gastritis, unspecified, without bleeding: Secondary | ICD-10-CM

## 2023-09-07 DIAGNOSIS — R1084 Generalized abdominal pain: Secondary | ICD-10-CM

## 2023-09-07 DIAGNOSIS — D509 Iron deficiency anemia, unspecified: Secondary | ICD-10-CM | POA: Diagnosis not present

## 2023-09-07 DIAGNOSIS — K5900 Constipation, unspecified: Secondary | ICD-10-CM

## 2023-09-07 DIAGNOSIS — R109 Unspecified abdominal pain: Secondary | ICD-10-CM

## 2023-09-07 DIAGNOSIS — F419 Anxiety disorder, unspecified: Secondary | ICD-10-CM | POA: Diagnosis not present

## 2023-09-07 DIAGNOSIS — R194 Change in bowel habit: Secondary | ICD-10-CM

## 2023-09-07 DIAGNOSIS — F32A Depression, unspecified: Secondary | ICD-10-CM | POA: Diagnosis not present

## 2023-09-07 DIAGNOSIS — B9681 Helicobacter pylori [H. pylori] as the cause of diseases classified elsewhere: Secondary | ICD-10-CM | POA: Diagnosis not present

## 2023-09-07 MED ORDER — SODIUM CHLORIDE 0.9 % IV SOLN: 500.0000 mL | Freq: Once | INTRAVENOUS | Status: DC

## 2023-09-07 NOTE — Op Note (Signed)
 [facility] Patient Name: Michele Arias Procedure Date: 09/07/2023 8:50 AM MRN: [id number] Endoscopist: Eugenia Hess , MD, [id number] Age: 38 Referring MD:  Date of Birth: 08-02-1985 Gender: Female Account #: [id number] Procedure:                Colonoscopy Indications:              This is the patient's first colonoscopy, Abdominal                            pain, Iron deficiency anemia, Constipation, Family                            history of colorectal cancer in a maternal cousin Medicines:                Monitored Anesthesia Care Procedure:                Pre-Anesthesia Assessment:                           - Prior to the procedure, a History and Physical                            was performed, and patient medications and                            allergies were reviewed. The patient's tolerance of                            previous anesthesia was also reviewed. The risks                            and benefits of the procedure and the sedation                            options and risks were discussed with the patient.                            All questions were answered, and informed consent                            was obtained. Prior Anticoagulants: The patient has                            taken no anticoagulant or antiplatelet agents. ASA                            Grade Assessment: II - A patient with mild systemic                            disease. After reviewing the risks and benefits,                            the patient was deemed in satisfactory condition to  undergo the procedure.                           After obtaining informed consent, the colonoscope                            was passed under direct vision. Throughout the                            procedure, the patient's blood pressure, pulse, and                            oxygen saturations were monitored continuously. The                             Olympus Scope SN: [id number] was introduced through                            the anus and advanced to the terminal ileum. The                            colonoscopy was performed without difficulty. The                            patient tolerated the procedure well. The quality                            of the bowel preparation was good. The terminal                            ileum, ileocecal valve, appendiceal orifice, and                            rectum were photographed. Scope In: 9:17:41 AM Scope Out: 9:27:20 AM Scope Withdrawal Time: 0 hours 7 minutes 56 seconds  Total Procedure Duration: 0 hours 9 minutes 39 seconds  Findings:                 The perianal and digital rectal examinations were                            normal. Pertinent negatives include normal                            sphincter tone and no palpable rectal lesions.                           The colon (entire examined portion) appeared normal.                           The terminal ileum appeared normal.                           The retroflexed view of the distal rectum and anal  verge was normal and showed no anal or rectal                            abnormalities. Complications:            No immediate complications. Estimated blood loss:                            None. Estimated Blood Loss:     Estimated blood loss: none. Impression:               - The entire examined colon is normal.                           - The examined portion of the ileum was normal.                           - The distal rectum and anal verge are normal on                            retroflexion view.                           - No specimens collected.                           - No findings on colonoscopy to explain iron                            deficiency anemia. Recommendation:           - Discharge patient to home (ambulatory).                           - Repeat colonoscopy for screening purposes  at age                            40 years.                           - The findings and recommendations were discussed                            with the patient's family.                           - Return to GI clinic in 2 months.                           - Patient has a contact number available for                            emergencies. The signs and symptoms of potential                            delayed complications were discussed with the  patient. Return to normal activities tomorrow.                            Written discharge instructions were provided to the                            patient. Eugenia Hess, MD 09/07/2023 9:33:36 AM This report has been signed electronically.

## 2023-09-07 NOTE — Patient Instructions (Addendum)
 Resume previous diet Continue present medications Await pathology results Follow up appointment Friday July 11th at 930 am, please arrive 15 minutes early to check in Next colonoscopy due at age 38, you will det a reminder letter at that time when it is time to schedule.  YOU HAD AN ENDOSCOPIC PROCEDURE TODAY AT THE [facility]:   Refer to the procedure report that was given to you for any specific questions about what was found during the examination.  If the procedure report does not answer your questions, please call your gastroenterologist to clarify.  If you requested that your care partner not be given the details of your procedure findings, then the procedure report has been included in a sealed envelope for you to review at your convenience later.  YOU SHOULD EXPECT: Some feelings of bloating in the abdomen. Passage of more gas than usual.  Walking can help get rid of the air that was put into your GI tract during the procedure and reduce the bloating. If you had a lower endoscopy (such as a colonoscopy or flexible sigmoidoscopy) you may notice spotting of blood in your stool or on the toilet paper. If you underwent a bowel prep for your procedure, you may not have a normal bowel movement for a few days.  Please Note:  You might notice some irritation and congestion in your nose or some drainage.  This is from the oxygen used during your procedure.  There is no need for concern and it should clear up in a day or so.  SYMPTOMS TO REPORT IMMEDIATELY:  Following lower endoscopy (colonoscopy):  Excessive amounts of blood in the stool  Significant tenderness or worsening of abdominal pains  Swelling of the abdomen that is new, acute  Fever of 100F or higher Following upper endoscopy (EGD)  Vomiting of blood or coffee ground material  New chest pain or pain under the shoulder blades  Painful or persistently difficult swallowing  New shortness of breath  Black, tarry-looking  stools For urgent or emergent issues, a gastroenterologist can be reached at any hour by calling [phone]. Do not use MyChart messaging for urgent concerns.   DIET:  We do recommend a small meal at first, but then you may proceed to your regular diet.  Drink plenty of fluids but you should avoid alcoholic beverages for 24 hours.  ACTIVITY:  You should plan to take it easy for the rest of today and you should NOT DRIVE or use heavy machinery until tomorrow (because of the sedation medicines used during the test).    FOLLOW UP: Our staff will call the number listed on your records the next business day following your procedure.  We will call around 7:15- 8:00 am to check on you and address any questions or concerns that you may have regarding the information given to you following your procedure. If we do not reach you, we will leave a message.     If any biopsies were taken you will be contacted by phone or by letter within the next 1-3 weeks.  Please call us  at [phone] if you have not heard about the biopsies in 3 weeks.    SIGNATURES/CONFIDENTIALITY: You and/or your care partner have signed paperwork which will be entered into your electronic medical record.  These signatures attest to the fact that that the information above on your After Visit Summary has been reviewed and is understood.  Full responsibility of the confidentiality of this discharge information lies  with you and/or your care-partner.

## 2023-09-07 NOTE — Progress Notes (Signed)
 Called to room to assist during endoscopic procedure.  Patient ID and intended procedure confirmed with present staff. Received instructions for my participation in the procedure from the performing physician.

## 2023-09-07 NOTE — Op Note (Signed)
 [facility] Patient Name: Michele Arias Procedure Date: 09/07/2023 8:52 AM MRN: [id number] Endoscopist: Eugenia Hess , MD, [id number] Age: 38 Referring MD:  Date of Birth: 10-10-1985 Gender: Female Account #: [id number] Procedure:                Upper GI endoscopy Indications:              Abdominal pain, Iron deficiency anemia Medicines:                Monitored Anesthesia Care Procedure:                Pre-Anesthesia Assessment:                           - Prior to the procedure, a History and Physical                            was performed, and patient medications and                            allergies were reviewed. The patient's tolerance of                            previous anesthesia was also reviewed. The risks                            and benefits of the procedure and the sedation                            options and risks were discussed with the patient.                            All questions were answered, and informed consent                            was obtained. Prior Anticoagulants: The patient has                            taken no anticoagulant or antiplatelet agents. ASA                            Grade Assessment: II - A patient with mild systemic                            disease. After reviewing the risks and benefits,                            the patient was deemed in satisfactory condition to                            undergo the procedure.                           After obtaining informed consent, the endoscope was  passed under direct vision. Throughout the                            procedure, the patient's blood pressure, pulse, and                            oxygen saturations were monitored continuously. The                            Olympus Scope SN [id number] was introduced through the                            mouth, and advanced to the second part of duodenum.                            The upper  GI endoscopy was accomplished without                            difficulty. The patient tolerated the procedure                            well. Scope In: Scope Out: Findings:                 The examined esophagus was normal.                           The gastric body, gastric antrum, cardia (on                            retroflexion) and gastric fundus (on retroflexion)                            were normal. Biopsies were taken with a cold                            forceps for Helicobacter pylori testing.                           The duodenal bulb and second portion of the                            duodenum were normal. Complications:            No immediate complications. Estimated blood loss:                            Minimal. Estimated Blood Loss:     Estimated blood loss was minimal. Impression:               - Normal esophagus.                           - Normal gastric body, antrum, cardia and gastric  fundus. Biopsied.                           - Normal duodenal bulb and second portion of the                            duodenum. Recommendation:           - Await pathology results.                           - Perform a colonoscopy today.                           - The findings and recommendations were discussed                            with the patient's family.                           - Return to GI clinic in 2 months. Eugenia Hess, MD 09/07/2023 9:15:30 AM This report has been signed electronically.

## 2023-09-07 NOTE — Progress Notes (Signed)
 Pt c/o tooth hurting, per Dr Yvone Herd, states probably from bite block, check to area of c/o, no bleeding, tooth is not loose.

## 2023-09-07 NOTE — Progress Notes (Signed)
 Pt sedate, gd SR's, VSS, report to RN

## 2023-09-08 ENCOUNTER — Telehealth: Payer: Self-pay | Admitting: *Deleted

## 2023-09-08 NOTE — Telephone Encounter (Signed)
 Left message on f/u call

## 2023-09-09 ENCOUNTER — Ambulatory Visit: Payer: Self-pay | Admitting: Pediatrics

## 2023-09-09 ENCOUNTER — Other Ambulatory Visit: Payer: Self-pay

## 2023-09-09 DIAGNOSIS — A048 Other specified bacterial intestinal infections: Secondary | ICD-10-CM

## 2023-09-09 LAB — SURGICAL PATHOLOGY

## 2023-09-10 ENCOUNTER — Other Ambulatory Visit (HOSPITAL_COMMUNITY): Payer: Self-pay

## 2023-09-10 MED ORDER — OMEPRAZOLE 20 MG PO CPDR
20.0000 mg | DELAYED_RELEASE_CAPSULE | Freq: Two times a day (BID) | ORAL | 0 refills | Status: DC
  Filled 2023-09-10: qty 20, 10d supply, fill #0

## 2023-09-10 MED ORDER — BISMUTH/METRONIDAZ/TETRACYCLIN 140-125-125 MG PO CAPS
3.0000 | ORAL_CAPSULE | Freq: Three times a day (TID) | ORAL | 0 refills | Status: DC
  Filled 2023-09-10: qty 120, 10d supply, fill #0

## 2023-09-10 NOTE — Addendum Note (Signed)
 Addended by: Velencia Lenart N on: 09/10/2023 09:00 AM   Modules accepted: Orders

## 2023-09-27 ENCOUNTER — Ambulatory Visit: Payer: 59 | Admitting: Physician Assistant

## 2023-09-27 ENCOUNTER — Ambulatory Visit (INDEPENDENT_AMBULATORY_CARE_PROVIDER_SITE_OTHER): Admitting: Physician Assistant

## 2023-09-27 VITALS — BP 100/72 | HR 108 | Temp 98.1°F | Ht 66.0 in | Wt 129.8 lb

## 2023-09-27 DIAGNOSIS — N9489 Other specified conditions associated with female genital organs and menstrual cycle: Secondary | ICD-10-CM

## 2023-09-27 DIAGNOSIS — E559 Vitamin D deficiency, unspecified: Secondary | ICD-10-CM

## 2023-09-27 DIAGNOSIS — M67441 Ganglion, right hand: Secondary | ICD-10-CM | POA: Diagnosis not present

## 2023-09-27 DIAGNOSIS — I493 Ventricular premature depolarization: Secondary | ICD-10-CM | POA: Diagnosis not present

## 2023-09-27 DIAGNOSIS — R790 Abnormal level of blood mineral: Secondary | ICD-10-CM

## 2023-09-27 DIAGNOSIS — Z1159 Encounter for screening for other viral diseases: Secondary | ICD-10-CM

## 2023-09-27 DIAGNOSIS — A048 Other specified bacterial intestinal infections: Secondary | ICD-10-CM

## 2023-09-27 LAB — CBC WITH DIFFERENTIAL/PLATELET
Basophils Absolute: 0 10*3/uL (ref 0.0–0.1)
Basophils Relative: 1.1 % (ref 0.0–3.0)
Eosinophils Absolute: 0.1 10*3/uL (ref 0.0–0.7)
Eosinophils Relative: 4.6 % (ref 0.0–5.0)
HCT: 32.9 % — ABNORMAL LOW (ref 36.0–46.0)
Hemoglobin: 11.2 g/dL — ABNORMAL LOW (ref 12.0–15.0)
Lymphocytes Relative: 43 % (ref 12.0–46.0)
Lymphs Abs: 1.1 10*3/uL (ref 0.7–4.0)
MCHC: 34 g/dL (ref 30.0–36.0)
MCV: 85.3 fl (ref 78.0–100.0)
Monocytes Absolute: 0.2 10*3/uL (ref 0.1–1.0)
Monocytes Relative: 6.7 % (ref 3.0–12.0)
Neutro Abs: 1.2 10*3/uL — ABNORMAL LOW (ref 1.4–7.7)
Neutrophils Relative %: 44.6 % (ref 43.0–77.0)
Platelets: 299 10*3/uL (ref 150.0–400.0)
RBC: 3.86 Mil/uL — ABNORMAL LOW (ref 3.87–5.11)
RDW: 14.7 % (ref 11.5–15.5)
WBC: 2.6 10*3/uL — ABNORMAL LOW (ref 4.0–10.5)

## 2023-09-27 LAB — VITAMIN D 25 HYDROXY (VIT D DEFICIENCY, FRACTURES): VITD: 15.06 ng/mL — ABNORMAL LOW (ref 30.00–100.00)

## 2023-09-27 NOTE — Progress Notes (Signed)
 Patient ID: Michele Arias, female    DOB: [birth year], 38 y.o.   MRN: [id number]   Assessment & Plan:   Ganglion cyst of tendon sheath of right hand  Vitamin D  deficiency -     VITAMIN D  25 Hydroxy (Vit-D Deficiency, Fractures)  Low ferritin -     Iron, TIBC and Ferritin Panel -     CBC with Differential/Platelet  Female pelvic congestion syndrome  H. pylori infection  PVCs (premature ventricular contractions)  Need for hepatitis C screening test -     Hepatitis C antibody    Helicobacter pylori infection Recent diagnosis confirmed by biopsy with associated gastric inflammation. Completed antibiotics and acid suppression therapy with no significant symptom improvement yet. -Follow with GI  Pelvic congestion syndrome Underwent interventional radiology procedure on August 02, 2023, for chronic venous congestion. Reports significant symptom improvement, though some residual pain persists. Cleared for all activities.  Premature ventricular contractions (PVCs) Occasional PVCs with palpitations. No recent changes in frequency or severity. Caffeine intake reduced to manage symptoms. - Reassess if symptoms worsen or become more frequent  Ganglion cyst of right hand 1 cm ganglion cyst on right dorsal hand with mild discomfort upon palpation. Surgical intervention is definitive but not currently necessary. - Consider surgical consultation if the cyst becomes bothersome or painful  Vitamin D  deficiency Vitamin D  deficiency. Not currently taking supplements despite having purchased them. - Order blood work to assess current vitamin D  levels - Encourage initiation of vitamin D  supplementation  Iron deficiency Iron deficiency with inconsistent adherence to supplementation regimen. - Order blood work to assess current iron levels - Encourage consistent use of iron supplements          Return in about 6 months (around 03/28/2024) for physical.    Subjective:    Chief  Complaint  Patient presents with   Medical Management of Chronic Issues    Pt in office today for 3 month f/u; having some sporadic chest pains x1 month; has a lump in right hand as well;    HPI Discussed the use of AI scribe software for clinical note transcription with the patient, who gave verbal consent to proceed.  History of Present Illness Michele Arias is a 38 year old female who presents for a three-month follow-up and lab work.  She is here to monitor her vitamin D  and iron levels. She has not been consistently taking her vitamin D  supplements, although she has purchased vitamin D  drops. She takes iron supplements in the form of drops but occasionally forgets to take them.  She recently completed a course of treatment for H. pylori infection, which was diagnosed following a colonoscopy and endoscopy. The procedure revealed inflammation and infection with H. pylori, for which she was prescribed a regimen of thirteen pills a day. She completed this treatment last Wednesday. No significant improvement in symptoms yet, which included stomach pain and discomfort.  Two months ago, she underwent a procedure for pelvic congestion syndrome, where a vein was blocked to divert blood flow and alleviate symptoms. She reports improvement in her symptoms, with less pain when standing for long periods, although some discomfort persists.  She has noticed a ganglion cyst on her right wrist, which is about a centimeter in size and causes pain when pressed. She is monitoring it for any changes.  She also mentions experiencing irregular heartbeats, described as feeling like her heart 'stops' momentarily, which she finds frightening. She has eliminated caffeine from her diet due to  its anxiety-inducing effects.     Past Medical History:  Diagnosis Date   Anemia    Anxiety    Depression    Medical history non-contributory    Pelvic congestion    UTI (urinary tract infection)     Past Surgical  History:  Procedure Laterality Date   IR EMBO VENOUS NOT HEMORR HEMANG  INC GUIDE ROADMAPPING  08/02/2023   IR RADIOLOGIST EVAL & MGMT  06/24/2023   IR RADIOLOGIST EVAL & MGMT  08/13/2023   NO PAST SURGERIES      Family History  Problem Relation Age of Onset   Alcohol abuse Mother    Depression Mother    Drug abuse Mother    Hypertension Mother    Mental illness Mother    Bipolar disorder Mother    Alcohol abuse Father    Drug abuse Sister    Mental illness Sister    Miscarriages / Stillbirths Sister    Alcohol abuse Brother    Depression Brother    Mental illness Brother    Alcohol abuse Brother    Drug abuse Brother    Colon cancer Maternal Uncle    Breast cancer Maternal Grandmother        ? age of diagnosis   Hypertension Maternal Grandmother    Stroke Maternal Grandmother    Cancer Paternal Grandmother    Esophageal cancer Neg Hx    Rectal cancer Neg Hx    Stomach cancer Neg Hx     Social History   Tobacco Use   Smoking status: Never   Smokeless tobacco: Never  Vaping Use   Vaping status: Never Used  Substance Use Topics   Alcohol use: Yes    Comment: rarely   Drug use: No     No Known Allergies  Review of Systems NEGATIVE UNLESS OTHERWISE INDICATED IN HPI      Objective:     BP 100/72 (BP Location: Left Arm, Patient Position: Sitting, Cuff Size: Normal)   Pulse (!) 108   Temp 98.1 F (36.7 C) (Temporal)   Ht 5\' 6"  (1.676 m)   Wt 129 lb 12.8 oz (58.9 kg)   LMP 09/21/2023 (Exact Date)   SpO2 99%   BMI 20.95 kg/m   Wt Readings from Last 3 Encounters:  09/27/23 129 lb 12.8 oz (58.9 kg)  09/07/23 130 lb (59 kg)  08/02/23 129 lb (58.5 kg)    BP Readings from Last 3 Encounters:  09/27/23 100/72  09/07/23 112/63  08/02/23 91/65     Physical Exam Vitals and nursing note reviewed.  Constitutional:      Appearance: Normal appearance.  Eyes:     Extraocular Movements: Extraocular movements intact.     Conjunctiva/sclera: Conjunctivae  normal.     Pupils: Pupils are equal, round, and reactive to light.  Cardiovascular:     Rate and Rhythm: Normal rate and regular rhythm.     Pulses: Normal pulses.  Pulmonary:     Effort: Pulmonary effort is normal.     Breath sounds: Normal breath sounds.  Musculoskeletal:     Comments: Subcutaneous nontender mobile 1 cm mass dorsum R hand  Neurological:     General: No focal deficit present.     Mental Status: She is alert.             Teka Chanda M Fenris Cauble, PA-C

## 2023-09-28 ENCOUNTER — Ambulatory Visit: Payer: Self-pay | Admitting: Physician Assistant

## 2023-09-28 LAB — HEPATITIS C ANTIBODY: Hepatitis C Ab: NONREACTIVE

## 2023-09-28 LAB — IRON,TIBC AND FERRITIN PANEL
%SAT: 10 % — ABNORMAL LOW (ref 16–45)
Ferritin: 4 ng/mL — ABNORMAL LOW (ref 16–154)
Iron: 35 ug/dL — ABNORMAL LOW (ref 40–190)
TIBC: 364 ug/dL (ref 250–450)

## 2023-09-29 NOTE — Telephone Encounter (Signed)
 Please see pt msg and advise if anything further is needed before repeating labs

## 2023-10-27 ENCOUNTER — Ambulatory Visit: Payer: Self-pay

## 2023-10-27 NOTE — Telephone Encounter (Signed)
 Noted

## 2023-10-27 NOTE — Telephone Encounter (Signed)
 FYI Only or Action Required?: FYI only for provider.  Patient was last seen in primary care on 09/27/23. Called Nurse Triage reporting Dizziness. Symptoms began yesterday. Interventions attempted: Nothing. Symptoms are: unchanged.  Triage Disposition: See PCP When Office is Open (Within 3 Days)  Patient/caregiver understands and will follow disposition?: Yes  **Pt. Scheduled for 7/3**         Copied from CRM [id number]. Topic: Clinical - Red Word Triage >> Oct 27, 2023  1:46 PM Aleatha C wrote: [facility] that prompted transfer to Nurse Triage: dizzy spells, can't get up without being dizzy and falling back down Reason for Disposition  [1] MILD dizziness (e.g., walking normally) AND [2] has NOT been evaluated by doctor (or NP/PA) for this  (Exception: Dizziness caused by heat exposure, sudden standing, or poor fluid intake.)  Answer Assessment - Initial Assessment Questions 1. DESCRIPTION: Describe your dizziness.     She stated yesterday it started with some dizziness, today she stated she stumbled with the dizziness.   2. LIGHTHEADED: Do you feel lightheaded? (e.g., somewhat faint, woozy, weak upon standing)     No   3. VERTIGO: Do you feel like either you or the room is spinning or tilting? (i.e. vertigo)     No  4. SEVERITY: How bad is it?  Do you feel like you are going to faint? Can you stand and walk?   - MILD: Feels slightly dizzy, but walking normally.   - MODERATE: Feels unsteady when walking, but not falling; interferes with normal activities (e.g., school, work).   - SEVERE: Unable to walk without falling, or requires assistance to walk without falling; feels like passing out now.       Mild   5. ONSET:  When did the dizziness begin?     X 1 day, the episodes las 2 minutes  6. AGGRAVATING FACTORS: Does anything make it worse? (e.g., standing, change in head position)     No   7. HEART RATE: Can you tell me your heart rate? How many beats in 15  seconds?  (Note: not all patients can do this)       No  8. CAUSE: What do you think is causing the dizziness?     Unknown   9. RECURRENT SYMPTOM: Have you had dizziness before? If Yes, ask: When was the last time? What happened that time?     No   10. OTHER SYMPTOMS: Do you have any other symptoms? (e.g., fever, chest pain, vomiting, diarrhea, bleeding)       No   11. PREGNANCY: Is there any chance you are pregnant? When was your last menstrual period?   No   She reports feeling off balance x 2 days ago  Protocols used: Dizziness - Lightheadedness-A-AH

## 2023-10-28 ENCOUNTER — Ambulatory Visit (INDEPENDENT_AMBULATORY_CARE_PROVIDER_SITE_OTHER): Admitting: Family Medicine

## 2023-10-28 ENCOUNTER — Other Ambulatory Visit: Payer: Self-pay

## 2023-10-28 ENCOUNTER — Encounter: Payer: Self-pay | Admitting: Family Medicine

## 2023-10-28 ENCOUNTER — Telehealth: Payer: Self-pay | Admitting: Physician Assistant

## 2023-10-28 VITALS — Temp 98.2°F | Ht 66.0 in | Wt 127.6 lb

## 2023-10-28 DIAGNOSIS — E559 Vitamin D deficiency, unspecified: Secondary | ICD-10-CM

## 2023-10-28 DIAGNOSIS — D729 Disorder of white blood cells, unspecified: Secondary | ICD-10-CM

## 2023-10-28 DIAGNOSIS — R42 Dizziness and giddiness: Secondary | ICD-10-CM

## 2023-10-28 DIAGNOSIS — R790 Abnormal level of blood mineral: Secondary | ICD-10-CM

## 2023-10-28 NOTE — Telephone Encounter (Signed)
 Future lab orders have been placed; please ensure pt isn't scheduled until 11/23/23 or after

## 2023-10-28 NOTE — Telephone Encounter (Signed)
 LVM to schedule pt lab only for either 11/23/23 or after.

## 2023-10-28 NOTE — Patient Instructions (Signed)
 It was very nice to see you today!  I think you probably have a mild case of vertigo.  Please let us  know if you would like a referral to see a vestibular rehab specialist to help realign the crystals.   Please make sure that you are getting plenty of fluids.  Let us  know if you have any worsening symptoms or if symptoms do not improve over the next couple of weeks.  Return if symptoms worsen or fail to improve.   Take care, Dr Kennyth  PLEASE NOTE:  If you had any lab tests, please let us  know if you have not heard back within a few days. You may see your results on mychart before we have a chance to review them but we will give you a call once they are reviewed by us .   If we ordered any referrals today, please let us  know if you have not heard from their office within the next week.   If you had any urgent prescriptions sent in today, please check with the pharmacy within an hour of our visit to make sure the prescription was transmitted appropriately.   Please try these tips to maintain a healthy lifestyle:  Eat at least 3 REAL meals and 1-2 snacks per day.  Aim for no more than 5 hours between eating.  If you eat breakfast, please do so within one hour of getting up.   Each meal should contain half fruits/vegetables, one quarter protein, and one quarter carbs (no bigger than a computer mouse)  Cut down on sweet beverages. This includes juice, soda, and sweet tea.   Drink at least 1 glass of water with each meal and aim for at least 8 glasses per day  Exercise at least 150 minutes every week.    Benign Positional Vertigo Vertigo is the feeling that you or your surroundings are moving when they are not. Benign positional vertigo is the most common form of vertigo. This is usually a harmless condition (benign). This condition is positional. This means that symptoms are triggered by certain movements and positions. This condition can be dangerous if it occurs while you are doing  something that could cause harm to yourself or others. This includes activities such as driving or operating machinery. What are the causes? The inner ear has fluid-filled canals that help your brain sense movement and balance. When the fluid moves, the brain receives messages about your body's position. With benign positional vertigo, calcium crystals in the inner ear break free and disturb the inner ear area. This causes your brain to receive confusing messages about your body's position. What increases the risk? You are more likely to develop this condition if: You are a woman. You are 31 years of age or older. You have recently had a head injury. You have an inner ear disease. What are the signs or symptoms? Symptoms of this condition usually happen when you move your head or your eyes in different directions. Symptoms may start suddenly and usually last for less than a minute. They include: Loss of balance and falling. Feeling like you are spinning or moving. Feeling like your surroundings are spinning or moving. Nausea and vomiting. Blurred vision. Dizziness. Involuntary eye movement (nystagmus). Symptoms can be mild and cause only minor problems, or they can be severe and interfere with daily life. Episodes of benign positional vertigo may return (recur) over time. Symptoms may also improve over time. How is this diagnosed? This condition may be diagnosed based  on: Your medical history. A physical exam of the head, neck, and ears. Positional tests to check for or stimulate vertigo. You may be asked to turn your head and change positions, such as going from sitting to lying down. A health care provider will watch for symptoms of vertigo. You may be referred to a health care provider who specializes in ear, nose, and throat problems (ENT or otolaryngologist) or a provider who specializes in disorders of the nervous system (neurologist). How is this treated?  This condition may be  treated in a session in which your health care provider moves your head in specific positions to help the displaced crystals in your inner ear move. Treatment for this condition may take several sessions. Surgery may be needed in severe cases, but this is rare. In some cases, benign positional vertigo may resolve on its own in 2-4 weeks. Follow these instructions at home: Safety Move slowly. Avoid sudden body or head movements or certain positions, as told by your health care provider. Avoid driving or operating machinery until your health care provider says it is safe. Avoid doing any tasks that would be dangerous to you or others if vertigo occurs. If you have trouble walking or keeping your balance, try using a cane for stability. If you feel dizzy or unstable, sit down right away. Return to your normal activities as told by your health care provider. Ask your health care provider what activities are safe for you. General instructions Take over-the-counter and prescription medicines only as told by your health care provider. Drink enough fluid to keep your urine pale yellow. Keep all follow-up visits. This is important. Contact a health care provider if: You have a fever. Your condition gets worse or you develop new symptoms. Your family or friends notice any behavioral changes. You have nausea or vomiting that gets worse. You have numbness or a prickling and tingling sensation. Get help right away if you: Have difficulty speaking or moving. Are always dizzy or faint. Develop severe headaches. Have weakness in your legs or arms. Have changes in your hearing or vision. Develop a stiff neck. Develop sensitivity to light. These symptoms may represent a serious problem that is an emergency. Do not wait to see if the symptoms will go away. Get medical help right away. Call your local emergency services (911 in the [place] Do not drive yourself to the hospital. Summary Vertigo is the feeling  that you or your surroundings are moving when they are not. Benign positional vertigo is the most common form of vertigo. This condition is caused by calcium crystals in the inner ear that become displaced. This causes a disturbance in an area of the inner ear that helps your brain sense movement and balance. Symptoms include loss of balance and falling, feeling that you or your surroundings are moving, nausea and vomiting, and blurred vision. This condition can be diagnosed based on symptoms, a physical exam, and positional tests. Follow safety instructions as told by your health care provider and keep all follow-up visits. This is important. This information is not intended to replace advice given to you by your health care provider. Make sure you discuss any questions you have with your health care provider. Document Revised: 11/02/2022 Document Reviewed: 11/02/2022 Elsevier Patient Education  2024 [employer].

## 2023-10-28 NOTE — Progress Notes (Signed)
 Malayah Arias is a 38 y.o. female who presents today for an office visit.  Assessment/Plan:  New/Acute Problems: Dizziness  No red flags.  Overall normal neurologic exam with exception of mildly positive Dix-Hallpike maneuver on the left.  Vital signs today are reassuring and no precipitous drop with orthostatic vital signs.  Her dizziness is likely BPPV based on her history and exam findings.  Very low suspicion for cardiac or neurologic etiology -do not think we need to pursue any further imaging or workup for this at this time.  We did discuss referral to vestibular rehab however she would like to hold off on this for now as symptoms are very manageable and she was predominantly concerned about ruling out neurologic causes.  We also did discuss importance of staying adequately hydrated especially during the summer months as this may be contributing some to her dizziness as well.  We discussed reasons to return to care.  She will let us  know if symptoms worsen or do not improve over the next couple of weeks.    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  Symptoms happened randomly while she was standing in her class. Symptoms lasted for about a minute and then resolved. It happened again yesterday under similar situation where she was squatting to help a student and then stood up and felt off balance.  She describes the dizziness as feeling off balance and may be a room spinning sensation.  She has had a little more headache recently.  No syncopal episodes.  She does have intermittent numbness and tingling in her right arm but this has been at baseline.  She was seen by her PCP about a month ago and had labs at that time which showed iron deficiency anemia and low vitamin D .  She has been taking supplements for both of these over last several weeks.        Objective:   Physical Exam: BP 100/62   Pulse 88   Temp 98.2 F (36.8 C) (Temporal)   Ht 5' 6 (1.676 m)   Wt 127 lb 9.6 oz (57.9 kg)   LMP 10/23/2023 (Approximate)   SpO2 98%   BMI 20.60 kg/m    Orthostatic VS for the past 72 hrs (Last 3 readings):  Orthostatic BP Patient Position BP Location Cuff Size Orthostatic Pulse  10/28/23 0928 110/66 Prone;Sitting -- Normal 76  10/28/23 0924 108/72 Supine Right Arm Normal 68  10/28/23 0923 110/66 Sitting Left Arm Normal 68  10/28/23 0823 100/62 -- -- -- 88    Gen: No acute distress, resting comfortably HEENT: tympanic membranes clear bilaterally CV: Regular rate and rhythm with no murmurs appreciated Pulm: Normal work of breathing, clear to auscultation bilaterally with no crackles, wheezes, or rhonchi Neuro:: Cranial nerves II through XII intact.  Finger-nose-finger testing intact bilaterally.  Strength 5 out of 5 in upper and lower extremities.  Reflexes 2+ and symmetric bilaterally.  Sensation light touch intact throughout.  Dix-Hallpike positive on the left. Psych: Normal affect and thought content  Time Spent: 30 minutes of total time was spent on the date of the encounter performing the following actions: chart review prior to seeing the patient, obtaining history, performing a medically necessary exam, counseling on the treatment plan, placing orders, and documenting in our EHR.        Worth HERO. Kennyth, MD 10/28/2023  9:01 AM

## 2023-10-28 NOTE — Telephone Encounter (Signed)
 During checkout, patient asked to schedule lab visit since following resulting on labs from June, she saw provider wanted her to recheck in 8 weeks. Please place lab orders.

## 2023-11-01 ENCOUNTER — Telehealth: Payer: Self-pay | Admitting: Pediatrics

## 2023-11-01 NOTE — Telephone Encounter (Signed)
 Lm on vm for patient to return call. Patient should be due for stool test later this week.

## 2023-11-01 NOTE — Telephone Encounter (Signed)
 Good afternoon Dr. Suzann  The following patient is scheduled to see you on 7/11 for a follow up for her H. Pylori. Unfortunately she has jury duty on this day and would like to know if it can be done over the phone or reschedule when she can come in person. Please advise. Thank you.

## 2023-11-02 NOTE — Telephone Encounter (Signed)
 Lm on vm for patient to return call.   11/05/23 follow up appt cancelled.

## 2023-11-02 NOTE — Telephone Encounter (Signed)
 Noted, thanks!

## 2023-11-02 NOTE — Telephone Encounter (Signed)
 Spoke with patient advised of the recommendations below. She will be picking up her stool kit on tomorrow and will advise us  once she has brought it back so that she can be rescheduled.

## 2023-11-05 ENCOUNTER — Ambulatory Visit: Admitting: Pediatrics

## 2023-11-09 ENCOUNTER — Other Ambulatory Visit

## 2023-11-09 DIAGNOSIS — A048 Other specified bacterial intestinal infections: Secondary | ICD-10-CM | POA: Diagnosis not present

## 2023-11-11 ENCOUNTER — Ambulatory Visit: Payer: Self-pay | Admitting: Pediatrics

## 2023-11-11 LAB — H. PYLORI ANTIGEN, STOOL: H pylori Ag, Stl: NEGATIVE

## 2023-12-08 ENCOUNTER — Other Ambulatory Visit: Payer: Self-pay | Admitting: Interventional Radiology

## 2023-12-08 DIAGNOSIS — D250 Submucous leiomyoma of uterus: Secondary | ICD-10-CM

## 2024-01-11 ENCOUNTER — Encounter: Payer: Self-pay | Admitting: Physician Assistant

## 2024-01-12 ENCOUNTER — Other Ambulatory Visit: Payer: Self-pay | Admitting: Physician Assistant

## 2024-01-12 ENCOUNTER — Other Ambulatory Visit (HOSPITAL_COMMUNITY): Payer: Self-pay

## 2024-01-12 MED ORDER — HYDROXYZINE PAMOATE 25 MG PO CAPS
25.0000 mg | ORAL_CAPSULE | Freq: Three times a day (TID) | ORAL | 0 refills | Status: AC | PRN
  Filled 2024-01-12: qty 30, 10d supply, fill #0

## 2024-01-12 NOTE — Telephone Encounter (Signed)
 Please see pt msg and advise any recommendations and advise on Rx refill. Pt future lab orders are already entered for patient to complete

## 2024-01-20 ENCOUNTER — Other Ambulatory Visit (INDEPENDENT_AMBULATORY_CARE_PROVIDER_SITE_OTHER)

## 2024-01-20 DIAGNOSIS — E559 Vitamin D deficiency, unspecified: Secondary | ICD-10-CM

## 2024-01-20 DIAGNOSIS — D729 Disorder of white blood cells, unspecified: Secondary | ICD-10-CM

## 2024-01-20 DIAGNOSIS — R790 Abnormal level of blood mineral: Secondary | ICD-10-CM

## 2024-01-20 LAB — CBC WITH DIFFERENTIAL/PLATELET
Basophils Absolute: 0 K/uL (ref 0.0–0.1)
Basophils Relative: 1 % (ref 0.0–3.0)
Eosinophils Absolute: 0.1 K/uL (ref 0.0–0.7)
Eosinophils Relative: 3.3 % (ref 0.0–5.0)
HCT: 31.9 % — ABNORMAL LOW (ref 36.0–46.0)
Hemoglobin: 10.6 g/dL — ABNORMAL LOW (ref 12.0–15.0)
Lymphocytes Relative: 36.2 % (ref 12.0–46.0)
Lymphs Abs: 1 K/uL (ref 0.7–4.0)
MCHC: 33.2 g/dL (ref 30.0–36.0)
MCV: 85.4 fl (ref 78.0–100.0)
Monocytes Absolute: 0.2 K/uL (ref 0.1–1.0)
Monocytes Relative: 7.2 % (ref 3.0–12.0)
Neutro Abs: 1.5 K/uL (ref 1.4–7.7)
Neutrophils Relative %: 52.3 % (ref 43.0–77.0)
Platelets: 261 K/uL (ref 150.0–400.0)
RBC: 3.74 Mil/uL — ABNORMAL LOW (ref 3.87–5.11)
RDW: 13.5 % (ref 11.5–15.5)
WBC: 2.9 K/uL — ABNORMAL LOW (ref 4.0–10.5)

## 2024-01-20 LAB — VITAMIN D 25 HYDROXY (VIT D DEFICIENCY, FRACTURES): VITD: 22.03 ng/mL — ABNORMAL LOW (ref 30.00–100.00)

## 2024-01-21 LAB — IRON,TIBC AND FERRITIN PANEL
%SAT: 14 % — ABNORMAL LOW (ref 16–45)
Ferritin: 6 ng/mL — ABNORMAL LOW (ref 16–154)
Iron: 61 ug/dL (ref 40–190)
TIBC: 431 ug/dL (ref 250–450)

## 2024-01-24 ENCOUNTER — Ambulatory Visit: Payer: Self-pay | Admitting: Physician Assistant

## 2024-01-27 ENCOUNTER — Ambulatory Visit
Admission: RE | Admit: 2024-01-27 | Discharge: 2024-01-27 | Disposition: A | Discharge status: Home / Self Care | Source: Ambulatory Visit | Attending: Interventional Radiology | Admitting: Interventional Radiology

## 2024-01-27 ENCOUNTER — Other Ambulatory Visit: Payer: Self-pay | Admitting: Interventional Radiology

## 2024-01-27 DIAGNOSIS — E2839 Other primary ovarian failure: Secondary | ICD-10-CM

## 2024-01-27 DIAGNOSIS — R102 Pelvic and perineal pain unspecified side: Secondary | ICD-10-CM

## 2024-01-27 DIAGNOSIS — I872 Venous insufficiency (chronic) (peripheral): Secondary | ICD-10-CM | POA: Diagnosis not present

## 2024-01-27 DIAGNOSIS — D250 Submucous leiomyoma of uterus: Secondary | ICD-10-CM

## 2024-01-27 HISTORY — PX: IR RADIOLOGIST EVAL & MGMT: IMG5224

## 2024-01-27 NOTE — Progress Notes (Signed)
 This encounter was conducted via the [employer] providing interactive audio and visual communication.  The patient provided verbal consent to conduct a virtual appointment.  The patient was located at their primary residence during this encounter.   Chief Complaint: Patient was seen in consultation today for chronic pelvic pain at the request of Raymar Joiner K  Referring Physician(s): Bliss Tsang K  History of Present Illness: Michele Arias is a 38 y.o. female a history of chronic pelvic pain due to ovarian venous insufficiency which manifested as symptoms of pelvic pain, heaviness, pressure, dyspareunia and bilateral lower extremity pain which is worse after long periods of standing and activity. Her symptoms were quite severe and affecting her quality of life.   She underwent bilateral ovarian venography pole on 08/02/2023 which confirmed retrograde flow in both ovarian veins. The veins were subsequently embolized during same session.   We spoke today via video chat for her 6 month follow-up.  She reports that she was doing well for several months before her symptoms began to recur over the past 6 weeks.  Her symptoms are now back to the same intensity as before but occur less frequently.   Past Medical History:  Diagnosis Date   Anemia    Anxiety    Depression    Medical history non-contributory    Pelvic congestion    UTI (urinary tract infection)     Past Surgical History:  Procedure Laterality Date   IR EMBO VENOUS NOT HEMORR HEMANG  INC GUIDE ROADMAPPING  08/02/2023   IR RADIOLOGIST EVAL & MGMT  06/24/2023   IR RADIOLOGIST EVAL & MGMT  08/13/2023   IR RADIOLOGIST EVAL & MGMT  01/27/2024   NO PAST SURGERIES      Allergies: Patient has no known allergies.  Medications: Prior to Admission medications   Medication Sig Start Date End Date Taking? Authorizing Provider  hydrOXYzine  (VISTARIL ) 25 MG capsule Take 1 capsule (25 mg total) by mouth every 8  (eight) hours as needed for anxiety. 01/12/24   Allwardt, Mardy HERO, PA-C     Family History  Problem Relation Age of Onset   Alcohol abuse Mother    Depression Mother    Drug abuse Mother    Hypertension Mother    Mental illness Mother    Bipolar disorder Mother    Anxiety disorder Mother    Alcohol abuse Father    Drug abuse Sister    Mental illness Sister    Miscarriages / [place] Sister    Alcohol abuse Brother    Depression Brother    Mental illness Brother    Alcohol abuse Brother    Drug abuse Brother    Colon cancer Maternal Uncle    Breast cancer Maternal Grandmother        ? age of diagnosis   Hypertension Maternal Grandmother    Stroke Maternal Grandmother    Cancer Maternal Grandmother    Cancer Paternal Grandmother    Hearing loss Maternal Uncle    Esophageal cancer Neg Hx    Rectal cancer Neg Hx    Stomach cancer Neg Hx     Social History   Socioeconomic History   Marital status: Married    Spouse name: Not on file   Number of children: Not on file   Years of education: Not on file   Highest education level: Not on file  Occupational History   Not on file  Tobacco Use   Smoking status: Never   Smokeless tobacco: Never  Vaping Use   Vaping status: Never Used  Substance and Sexual Activity   Alcohol use: Yes    Comment: rarely   Drug use: No   Sexual activity: Not Currently    Partners: Male    Birth control/protection: Surgical    Comment: husband-vasectomy  Other Topics Concern   Not on file  Social History Narrative   Left handed   Lives with husband and to children in a two story   Caffeine rarely   Social Drivers of Corporate investment banker Strain: Not on file  Food Insecurity: Not on file  Transportation Needs: Not on file  Physical Activity: Not on file  Stress: Not on file  Social Connections: Not on file    Review of Systems: A 12 point ROS discussed and pertinent positives are indicated in the HPI above.  All other  systems are negative.  Review of Systems  Vital Signs: There were no vitals taken for this visit.    Physical Exam Constitutional:      General: She is not in acute distress.    Appearance: Normal appearance. She is normal weight.  HENT:     Head: Normocephalic and atraumatic.  Eyes:     General: No scleral icterus. Pulmonary:     Effort: Pulmonary effort is normal.  Neurological:     Mental Status: She is alert and oriented to person, place, and time.  Psychiatric:        Mood and Affect: Mood normal.        Behavior: Behavior normal.      Imaging: IR Radiologist Eval & Mgmt Result Date: 01/27/2024 EXAM: ESTABLISHED PATIENT OFFICE VISIT CHIEF COMPLAINT: SEE NOTE IN EPIC HISTORY OF PRESENT ILLNESS: SEE NOTE IN EPIC REVIEW OF SYSTEMS: SEE NOTE IN EPIC PHYSICAL EXAMINATION: SEE NOTE IN EPIC ASSESSMENT AND PLAN: SEE NOTE IN EPIC Electronically Signed   By: Wilkie Lent M.D.   On: 01/27/2024 12:51    Labs:  CBC: Recent Labs    03/29/23 1031 08/02/23 0805 09/27/23 0834 01/20/24 0828  WBC 3.6* 5.2 2.6* 2.9*  HGB 10.9* 11.9* 11.2* 10.6*  HCT 32.0* 35.7* 32.9* 31.9*  PLT 282.0 300 299.0 261.0    COAGS: Recent Labs    08/02/23 0805  INR 1.0    BMP: Recent Labs    03/29/23 1031 08/02/23 0805  NA 138 139  K 3.6 3.4*  CL 106 106  CO2 26 24  GLUCOSE 73 94  BUN 10 15  CALCIUM 8.9 9.0  CREATININE 0.72 0.67  GFRNONAA  --  >60    LIVER FUNCTION TESTS: Recent Labs    03/29/23 1031  BILITOT 0.7  AST 15  ALT 11  ALKPHOS 36*  PROT 7.0  ALBUMIN 4.1    TUMOR MARKERS: No results for input(s): AFPTM, CEA, CA199, CHROMGRNA in the last 8760 hours.  Assessment and Plan:  Very pleasant 38 year-old female with a history of bilateral ovarian venous insufficiency and chronic pelvic pain now with recurrent symptoms 6 months post successful embolization.    1.) CT Venogram abdomen and pelvis followed by a clinic visit to discuss the imaging and  potential plan   Electronically Signed: Wilkie MARLA Lent 01/27/2024, 1:51 PM   This encounter was conducted via the [employer] providing interactive audio and visual communication.  The patient provided verbal consent to conduct a virtual appointment.  The patient was located at their primary residence during this encounter.   I spent a total  of  15 Minutes in face to face in clinical consultation, greater than 50% of which was counseling/coordinating care for pelvic venous insufficiency

## 2024-02-01 ENCOUNTER — Encounter: Payer: Self-pay | Admitting: Physician Assistant

## 2024-02-01 ENCOUNTER — Telehealth (INDEPENDENT_AMBULATORY_CARE_PROVIDER_SITE_OTHER): Admitting: Physician Assistant

## 2024-02-01 DIAGNOSIS — E559 Vitamin D deficiency, unspecified: Secondary | ICD-10-CM | POA: Diagnosis not present

## 2024-02-01 DIAGNOSIS — R42 Dizziness and giddiness: Secondary | ICD-10-CM | POA: Diagnosis not present

## 2024-02-01 DIAGNOSIS — G2581 Restless legs syndrome: Secondary | ICD-10-CM | POA: Diagnosis not present

## 2024-02-01 DIAGNOSIS — R790 Abnormal level of blood mineral: Secondary | ICD-10-CM

## 2024-02-01 DIAGNOSIS — F419 Anxiety disorder, unspecified: Secondary | ICD-10-CM | POA: Diagnosis not present

## 2024-02-01 DIAGNOSIS — D729 Disorder of white blood cells, unspecified: Secondary | ICD-10-CM

## 2024-02-01 DIAGNOSIS — R519 Headache, unspecified: Secondary | ICD-10-CM

## 2024-02-01 NOTE — Progress Notes (Signed)
 Virtual Visit via Video Note  I connected with  Michele Arias  on 02/01/24 at  2:00 PM EDT by a video enabled telemedicine application and verified that I am speaking with the correct person using two identifiers.  Location: Patient: parked car at work in [employer]  Provider: Nature conservation officer at [employer] Persons present: Patient and myself   I discussed the limitations of evaluation and management by telemedicine and the availability of in person appointments. The patient expressed understanding and agreed to proceed.   History of Present Illness:  Discussed the use of AI scribe software for clinical note transcription with the patient, who gave verbal consent to proceed.  History of Present Illness Michele Arias is a 38 year old female who presents with follow-up on recent blood work and symptoms of dizziness and headaches.  She is following up on recent blood work which showed a white blood cell count of 2.9, hemoglobin of 10.6, normal MCV of 85, and a very low ferritin storage of 6. She has a history of low iron levels over the past couple of years and a mild vitamin D  deficiency, currently at 13, which has improved from previous levels.  She reports a sudden onset of dizziness, room spinning, lightheadedness, and nausea that occurred two weeks ago while lying in bed. She experienced difficulty focusing her eyes, which felt like they were crossing, and the symptoms persisted for about an hour. She felt nauseous but did not vomit and eventually fell asleep due to heavy eyelids. This episode was similar to a previous incident at work but lasted longer. She denies any recent illness or COVID-19.  She has experienced neck and lymph node pain and swelling, along with pressure in her ears and the back of her head. She reports having headaches for the past week, which are intermittent but occur daily. The headaches are not associated with dizziness.  She has a history of low iron  levels and has been taking iron and vitamin D  drops inconsistently due to stomach upset from iron pills. She reports random spotting before her menstrual cycle and has discussed this with her OB. She experiences leg pain, which she associates with a previous pelvic surgery. She also reports numbness in her right arm that occurred while at work.  She denies smoking, vaping, or alcohol use. She reports a decrease in appetite, leading to weight loss from 129 lbs in June to 125 lbs currently. She occasionally experiences restless leg syndrome symptoms and has a history of night sweats, which have resolved. She uses hydroxyzine  for anxiety and sleep issues but does not take it regularly.     Observations/Objective:   Gen: Awake, alert, no acute distress Resp: Breathing is even and non-labored Psych: calm/pleasant demeanor Neuro: Alert and Oriented x 3, + facial symmetry, speech is clear.   Assessment and Plan:  Assessment and Plan Assessment & Plan Iron deficiency anemia and leukopenia Chronic iron deficiency anemia with low ferritin levels, worsening over the past few years. Current hemoglobin is 10.6, indicating worsening anemia. Leukopenia with a white blood cell count of 2.9, fluctuating over the past few years. Differential includes possible viral infection or autoimmune process. No recent illness reported. - Refer to hematology for evaluation and management, including potential iron infusion and further workup of leukopenia. - Discuss potential for iron infusion  Headache, dizziness, and vertigo Intermittent headaches with associated dizziness and vertigo. Recent episode of sudden onset dizziness with room spinning, lightheadedness, and nausea lasting about  an hour. No headache or light sensitivity during the episode. Headaches have been occurring daily for the past week, described as pressure-like and affecting the ears and head. No dizziness associated with headaches. Differential  includes possible iron deficiency contributing to symptoms. - Refer to hematology for further evaluation as part of the workup for iron deficiency anemia. - No red flag symptoms, if so, consider ER / imaging  Restless legs syndrome and leg pain Leg pain and symptoms consistent with restless legs syndrome, possibly related to low iron levels. Symptoms include heavy, restless legs, and a numbing pain. History of pelvic surgery with recent recurrence of leg pain. Differential includes low iron contributing to symptoms. - Refer to hematology for evaluation and management of iron deficiency, which may be contributing to restless legs syndrome and leg pain.  Vitamin D  deficiency Mild vitamin D  deficiency with a level of 22, which is an improvement from previous levels. No specific symptoms attributed to this deficiency at present. Recommend daily OTC 2000 int units daily.   Anxiety disorder Anxiety related to health concerns and lab results. Hydroxyzine  available for anxiety management, but not frequently used. - Use hydroxyzine  as needed for anxiety management.    Follow Up Instructions:   F/up as scheduled in December   I discussed the assessment and treatment plan with the patient. The patient was provided an opportunity to ask questions and all were answered. The patient agreed with the plan and demonstrated an understanding of the instructions.   The patient was advised to call back or seek an in-person evaluation if the symptoms worsen or if the condition fails to improve as anticipated.  Alaisa Moffitt M Tylea Hise, PA-C

## 2024-02-09 ENCOUNTER — Other Ambulatory Visit: Payer: Self-pay | Admitting: Medical Genetics

## 2024-02-09 DIAGNOSIS — Z006 Encounter for examination for normal comparison and control in clinical research program: Secondary | ICD-10-CM

## 2024-02-16 ENCOUNTER — Other Ambulatory Visit: Payer: Self-pay | Admitting: Oncology

## 2024-02-16 DIAGNOSIS — D72819 Decreased white blood cell count, unspecified: Secondary | ICD-10-CM

## 2024-02-21 ENCOUNTER — Telehealth: Payer: Self-pay | Admitting: Oncology

## 2024-02-21 NOTE — Telephone Encounter (Signed)
PT called to reschedule lab appt

## 2024-02-23 ENCOUNTER — Other Ambulatory Visit

## 2024-02-23 ENCOUNTER — Inpatient Hospital Stay: Attending: Oncology

## 2024-02-23 ENCOUNTER — Inpatient Hospital Stay

## 2024-02-23 DIAGNOSIS — Z01419 Encounter for gynecological examination (general) (routine) without abnormal findings: Secondary | ICD-10-CM | POA: Diagnosis not present

## 2024-02-23 DIAGNOSIS — D72819 Decreased white blood cell count, unspecified: Secondary | ICD-10-CM | POA: Diagnosis not present

## 2024-02-23 LAB — CMP (CANCER CENTER ONLY)
ALT: 10 U/L (ref 0–44)
AST: 18 U/L (ref 15–41)
Albumin: 4.6 g/dL (ref 3.5–5.0)
Alkaline Phosphatase: 50 U/L (ref 38–126)
Anion gap: 9 (ref 5–15)
BUN: 10 mg/dL (ref 6–20)
CO2: 28 mmol/L (ref 22–32)
Calcium: 9.6 mg/dL (ref 8.9–10.3)
Chloride: 105 mmol/L (ref 98–111)
Creatinine: 0.76 mg/dL (ref 0.44–1.00)
GFR, Estimated: >60 mL/min (ref >60)
Glucose, Bld: 71 mg/dL (ref 70–99)
Potassium: 3.4 mmol/L — ABNORMAL LOW (ref 3.5–5.1)
Sodium: 142 mmol/L (ref 135–145)
Total Bilirubin: 0.7 mg/dL (ref 0.0–1.2)
Total Protein: 7.5 g/dL (ref 6.5–8.1)

## 2024-02-23 LAB — CBC WITH DIFFERENTIAL (CANCER CENTER ONLY)
Abs Immature Granulocytes: 0.01 K/uL (ref 0.00–0.07)
Basophils Absolute: 0 K/uL (ref 0.0–0.1)
Basophils Relative: 1 %
Eosinophils Absolute: 0.2 K/uL (ref 0.0–0.5)
Eosinophils Relative: 3 %
HCT: 32.4 % — ABNORMAL LOW (ref 36.0–46.0)
Hemoglobin: 10.5 g/dL — ABNORMAL LOW (ref 12.0–15.0)
Immature Granulocytes: 0 %
Lymphocytes Relative: 34 %
Lymphs Abs: 1.8 K/uL (ref 0.7–4.0)
MCH: 28.2 pg (ref 26.0–34.0)
MCHC: 32.4 g/dL (ref 30.0–36.0)
MCV: 86.9 fL (ref 80.0–100.0)
Monocytes Absolute: 0.4 K/uL (ref 0.1–1.0)
Monocytes Relative: 7 %
Neutro Abs: 3 K/uL (ref 1.7–7.7)
Neutrophils Relative %: 55 %
Platelet Count: 283 K/uL (ref 150–400)
RBC: 3.73 MIL/uL — ABNORMAL LOW (ref 3.87–5.11)
RDW: 12.9 % (ref 11.5–15.5)
WBC Count: 5.4 K/uL (ref 4.0–10.5)
nRBC: 0 % (ref 0.0–0.2)

## 2024-02-23 LAB — VITAMIN B12: Vitamin B-12: 482 pg/mL (ref 180–914)

## 2024-02-23 LAB — IRON AND TIBC
Iron: 41 ug/dL (ref 28–170)
Saturation Ratios: 8 % — ABNORMAL LOW (ref 10.4–31.8)
TIBC: 486 ug/dL — ABNORMAL HIGH (ref 250–450)
UIBC: 445 ug/dL

## 2024-02-23 LAB — FOLATE: Folate: 16.2 ng/mL (ref >5.9)

## 2024-02-23 LAB — LACTATE DEHYDROGENASE: LDH: 191 U/L (ref 98–192)

## 2024-02-23 LAB — FERRITIN: Ferritin: 9 ng/mL — ABNORMAL LOW (ref 11–307)

## 2024-02-24 DIAGNOSIS — N920 Excessive and frequent menstruation with regular cycle: Secondary | ICD-10-CM | POA: Insufficient documentation

## 2024-02-24 DIAGNOSIS — R102 Pelvic and perineal pain unspecified side: Secondary | ICD-10-CM | POA: Insufficient documentation

## 2024-02-24 LAB — ANA W/REFLEX IF POSITIVE: Anti Nuclear Antibody (ANA): NEGATIVE

## 2024-02-25 ENCOUNTER — Other Ambulatory Visit: Payer: Self-pay | Admitting: Obstetrics and Gynecology

## 2024-02-25 DIAGNOSIS — Z1231 Encounter for screening mammogram for malignant neoplasm of breast: Secondary | ICD-10-CM

## 2024-02-29 ENCOUNTER — Telehealth

## 2024-03-07 ENCOUNTER — Ambulatory Visit
Admission: RE | Admit: 2024-03-07 | Discharge: 2024-03-07 | Disposition: A | Discharge status: Home / Self Care | Source: Ambulatory Visit | Attending: Interventional Radiology | Admitting: Interventional Radiology

## 2024-03-07 ENCOUNTER — Encounter: Payer: Self-pay | Admitting: Oncology

## 2024-03-07 ENCOUNTER — Inpatient Hospital Stay: Attending: Oncology | Admitting: Oncology

## 2024-03-07 ENCOUNTER — Telehealth: Payer: Self-pay | Admitting: Oncology

## 2024-03-07 VITALS — BP 111/80 | HR 77 | Temp 98.4°F | Resp 16 | Wt 131.1 lb

## 2024-03-07 DIAGNOSIS — N920 Excessive and frequent menstruation with regular cycle: Secondary | ICD-10-CM | POA: Insufficient documentation

## 2024-03-07 DIAGNOSIS — D500 Iron deficiency anemia secondary to blood loss (chronic): Secondary | ICD-10-CM | POA: Diagnosis not present

## 2024-03-07 DIAGNOSIS — D72819 Decreased white blood cell count, unspecified: Secondary | ICD-10-CM | POA: Insufficient documentation

## 2024-03-07 DIAGNOSIS — R102 Pelvic and perineal pain unspecified side: Secondary | ICD-10-CM

## 2024-03-07 DIAGNOSIS — Z800 Family history of malignant neoplasm of digestive organs: Secondary | ICD-10-CM | POA: Insufficient documentation

## 2024-03-07 MED ORDER — IOPAMIDOL (ISOVUE-370) INJECTION 76%
100.0000 mL | Freq: Once | INTRAVENOUS | Status: AC | PRN
  Administered 2024-03-07: 100 mL via INTRAVENOUS

## 2024-03-07 NOTE — Telephone Encounter (Signed)
 Left voicemail on PT's phone with appointment details for 11/14.

## 2024-03-07 NOTE — Assessment & Plan Note (Signed)
 Chronic intermittent leukopenia with white blood cell counts fluctuating between 2,600 and 5,400. Recent count was normal at 5,400. Considered benign leukopenia, likely due to stress or infection response.   No evidence of autoimmune disease as ANA test was negative.  B12 and folate are normal.  No intervention needed currently

## 2024-03-07 NOTE — Progress Notes (Signed)
 [facility]  HEMATOLOGY CLINIC CONSULTATION NOTE   PATIENT NAME: Michele Arias   MR#: [id number] DOB: 07/12/1985  DATE OF SERVICE: 03/07/2024   REFERRING PROVIDER  Allwardt, Mardy HERO, PA-C   Patient Care Team: Allwardt, Mardy HERO, PA-C as PCP - General (Physician Assistant) Dann Candyce RAMAN, MD as PCP - Cardiology ([facility] as Consulting Physician (Obstetrics and Gynecology)   REASON FOR CONSULTATION/ CHIEF COMPLAINT: Iron deficiency anemia, intermittent leukopenia  ASSESSMENT & PLAN:  Michele Arias is a 38 y.o. lady with a past medical history of anxiety, depression, iron deficiency, was referred to our service for evaluation of iron deficiency anemia and intermittent leukopenia.    Iron deficiency anemia due to chronic blood loss Chronic iron deficiency anemia likely secondary to heavy menstrual bleeding.   Hemoglobin levels fluctuate between 10.5 and 12.7, with a recent level of 10.5. Iron saturation is low at 8%, and ferritin levels are consistently low, indicating depleted iron stores.  Oral iron supplements were not tolerated due to gastrointestinal side effects. B12, folic acid, and ANA tests are normal, ruling out other causes of anemia.   Discussed potential interventions with OBGYN to manage heavy menstrual bleeding, including birth control, IUD, ablation, embolization, or hysterectomy.   IV iron infusion is planned to improve iron levels and energy.   We get approval to proceed with Venofer.  Will plan for weekly x 5 doses.  - Coordinate with OBGYN for potential interventions to manage heavy menstrual bleeding. - Will schedule follow-up appointment in five months to reassess iron levels and treatment efficacy.  Leukopenia Chronic intermittent leukopenia with white blood cell counts fluctuating between 2,600 and 5,400. Recent count was normal at 5,400. Considered benign leukopenia, likely due to stress or  infection response.   No evidence of autoimmune disease as ANA test was negative.  B12 and folate are normal.  No intervention needed currently    I reviewed lab results and outside records for this visit and discussed relevant results with the patient. Diagnosis, plan of care and treatment options were also discussed in detail with the patient. Opportunity provided to ask questions and answers provided to her apparent satisfaction. Provided instructions to call our clinic with any problems, questions or concerns prior to return visit. I recommended to continue follow-up with PCP and sub-specialists. She verbalized understanding and agreed with the plan. No barriers to learning was detected.  Michele Panek, MD  03/07/2024 5:51 PM  [facility].  HOSPITAL [id number] [place] [address] Dept: [phone] Dept Fax: [phone]   HISTORY OF PRESENT ILLNESS:  Discussed the use of AI scribe software for clinical note transcription with the patient, who gave verbal consent to proceed.  History of Present Illness Michele Arias is a 38 year old female who presents with low iron levels and fluctuating white blood cell count. She was referred by Dr. Mardy Barrio for evaluation of low iron levels and fluctuating white blood cell count.  She has experienced low iron levels and fluctuating white blood cell counts since at least 2015. Her white blood cell count has consistently been on the lower side, ranging from 2,600 to 4,600, with the lowest recorded value being 2,600 in June 2025. Hemoglobin levels have fluctuated between 10.5 and 12.7. Despite these fluctuations, platelet counts have remained normal.  She has been taking over-the-counter liquid iron supplements twice daily but discontinued them due to  lack of improvement. She experiences heavy menstrual cycles but denies other sources of blood loss. Iron  saturation is low at 8%, and ferritin levels have consistently been low. Other tests, including B12, folic acid, and ANA, were normal.  She reports experiencing double vision as of this morning, which is a new symptom. She has a history of menstrual pain and is scheduled to see her OB-GYN in two months to discuss options for managing her heavy cycles and associated pain. She has undergone a CT scan and ultrasound as part of her ongoing evaluation for these issues.   MEDICAL HISTORY Past Medical History:  Diagnosis Date   Anemia    Anxiety    Depression    Medical history non-contributory    Pelvic congestion    UTI (urinary tract infection)      SURGICAL HISTORY Past Surgical History:  Procedure Laterality Date   IR EMBO VENOUS NOT HEMORR HEMANG  INC GUIDE ROADMAPPING  08/02/2023   IR RADIOLOGIST EVAL & MGMT  06/24/2023   IR RADIOLOGIST EVAL & MGMT  08/13/2023   IR RADIOLOGIST EVAL & MGMT  01/27/2024   NO PAST SURGERIES       SOCIAL HISTORY: She reports that she has never smoked. She has never used smokeless tobacco. She reports current alcohol use. She reports that she does not use drugs. Social History   Socioeconomic History   Marital status: Married    Spouse name: Not on file   Number of children: Not on file   Years of education: Not on file   Highest education level: Not on file  Occupational History   Not on file  Tobacco Use   Smoking status: Never   Smokeless tobacco: Never  Vaping Use   Vaping status: Never Used  Substance and Sexual Activity   Alcohol use: Yes    Comment: rarely   Drug use: No   Sexual activity: Not Currently    Partners: Male    Birth control/protection: Surgical    Comment: husband-vasectomy  Other Topics Concern   Not on file  Social History Narrative   Left handed   Lives with husband and to children in a two story   Caffeine rarely   Social Drivers of Corporate Investment Banker Strain: Not on file  Food Insecurity: No  Food Insecurity (03/07/2024)   Hunger Vital Sign    Worried About Running Out of Food in the Last Year: Never true    Ran Out of Food in the Last Year: Never true  Transportation Needs: No Transportation Needs (03/07/2024)   PRAPARE - Administrator, Civil Service (Medical): No    Lack of Transportation (Non-Medical): No  Physical Activity: Not on file  Stress: Not on file  Social Connections: Not on file  Intimate Partner Violence: Not At Risk (03/07/2024)   Humiliation, Afraid, Rape, and Kick questionnaire    Fear of Current or Ex-Partner: No    Emotionally Abused: No    Physically Abused: No    Sexually Abused: No    FAMILY HISTORY: Her family history includes Alcohol abuse in her brother, brother, father, and mother; Anxiety disorder in her mother; Bipolar disorder in her mother; Breast cancer in her maternal grandmother; Cancer in her maternal grandmother and paternal grandmother; Colon cancer (age of onset: 48 - 66) in her maternal uncle; Depression in her brother and mother; Drug abuse in her brother, mother, and sister; Hearing loss in her maternal uncle; Hypertension in her  maternal grandmother and mother; Mental illness in her brother, mother, and sister; Miscarriages / Stillbirths in her sister; Stroke in her maternal grandmother.  CURRENT MEDICATIONS   Current Outpatient Medications  Medication Instructions   hydrOXYzine  (VISTARIL ) 25 mg, Oral, Every 8 hours PRN     ALLERGIES  She has no known allergies.  REVIEW OF SYSTEMS:  Review of Systems  Constitutional:  Positive for fatigue.  Respiratory:  Positive for shortness of breath (with exertion).   Neurological:  Positive for dizziness and numbness.     Rest of the pertinent review of systems is unremarkable except as mentioned above in HPI.  PHYSICAL EXAMINATION:    Onc Performance Status - 03/07/24 1411       ECOG Perf Status   ECOG Perf Status Ambulatory and capable of all selfcare but unable  to carry out any work activities.  Up and about more than 50% of waking hours      KPS SCALE   KPS % SCORE Normal activity with effort, some s/s of disease          Vitals:   03/07/24 1406  BP: 111/80  Pulse: 77  Resp: 16  Temp: 98.4 F (36.9 C)  SpO2: 100%   Filed Weights   03/07/24 1406  Weight: 131 lb 1.6 oz (59.5 kg)    Physical Exam Constitutional:      General: She is not in acute distress.    Appearance: Normal appearance.  HENT:     Head: Normocephalic and atraumatic.  Cardiovascular:     Rate and Rhythm: Normal rate.  Pulmonary:     Effort: Pulmonary effort is normal. No respiratory distress.  Abdominal:     General: There is no distension.  Neurological:     General: No focal deficit present.     Mental Status: She is alert and oriented to person, place, and time.  Psychiatric:        Mood and Affect: Mood normal.        Behavior: Behavior normal.     LABORATORY DATA:   I have reviewed the data as listed.  Recent Results (from the past 2160 hours)  Vitamin D  (25 hydroxy)     Status: Abnormal   Collection Time: 01/20/24  8:28 AM  Result Value Ref Range   VITD 22.03 (L) 30.00 - 100.00 ng/mL  Iron, TIBC and Ferritin Panel     Status: Abnormal   Collection Time: 01/20/24  8:28 AM  Result Value Ref Range   Iron 61 40 - 190 mcg/dL   TIBC 568 749 - 549 mcg/dL (calc)   %SAT 14 (L) 16 - 45 % (calc)   Ferritin 6 (L) 16 - 154 ng/mL  CBC with Differential/Platelet     Status: Abnormal   Collection Time: 01/20/24  8:28 AM  Result Value Ref Range   WBC 2.9 (L) 4.0 - 10.5 K/uL   RBC 3.74 (L) 3.87 - 5.11 Mil/uL   Hemoglobin 10.6 (L) 12.0 - 15.0 g/dL   HCT 68.0 (L) 63.9 - 53.9 %   MCV 85.4 78.0 - 100.0 fl   MCHC 33.2 30.0 - 36.0 g/dL   RDW 86.4 88.4 - 84.4 %   Platelets 261.0 150.0 - 400.0 K/uL   Neutrophils Relative % 52.3 43.0 - 77.0 %   Lymphocytes Relative 36.2 12.0 - 46.0 %   Monocytes Relative 7.2 3.0 - 12.0 %   Eosinophils Relative 3.3 0.0 -  5.0 %   Basophils Relative 1.0 0.0 -  3.0 %   Neutro Abs 1.5 1.4 - 7.7 K/uL   Lymphs Abs 1.0 0.7 - 4.0 K/uL   Monocytes Absolute 0.2 0.1 - 1.0 K/uL   Eosinophils Absolute 0.1 0.0 - 0.7 K/uL   Basophils Absolute 0.0 0.0 - 0.1 K/uL  ANA w/Reflex if Positive     Status: None   Collection Time: 02/23/24  3:36 PM  Result Value Ref Range   Anti Nuclear Antibody (ANA) Negative Negative    Comment: (NOTE) Performed At: [facility] [address] [place], [place] [id number] Jennette Shorter MD Ey:[id number]   Folate     Status: None   Collection Time: 02/23/24  3:36 PM  Result Value Ref Range   Folate 16.2 >5.9 ng/mL    Comment: Performed at [facility], [address], [place], [place] [address]  Ferritin     Status: Abnormal   Collection Time: 02/23/24  3:36 PM  Result Value Ref Range   Ferritin 9 (L) 11 - 307 ng/mL    Comment: Performed at [facility], [address], [place], [place] [address]  Vitamin B12     Status: None   Collection Time: 02/23/24  3:40 PM  Result Value Ref Range   Vitamin B-12 482 180 - 914 pg/mL    Comment: (NOTE) This assay is not validated for testing neonatal or myeloproliferative syndrome specimens for Vitamin B12 levels. Performed at [facility], [address], [place], [place] [address]   Iron and TIBC     Status: Abnormal   Collection Time: 02/23/24  3:40 PM  Result Value Ref Range   Iron 41 28 - 170 ug/dL   TIBC 513 (H) 250 - 450 ug/dL   Saturation Ratios 8 (L) 10.4 - 31.8 %   UIBC 445 ug/dL    Comment: Performed at [facility], [address] [place], [place] [address]  Lactate dehydrogenase     Status: None   Collection Time: 02/23/24  3:40 PM  Result Value Ref Range   LDH 191 98 - 192 U/L    Comment: Performed at [facility], [address], [place], [place] [address]  CMP (Cancer Center only)     Status: Abnormal   Collection Time: 02/23/24  3:40 PM  Result Value Ref  Range   Sodium 142 135 - 145 mmol/L   Potassium 3.4 (L) 3.5 - 5.1 mmol/L   Chloride 105 98 - 111 mmol/L   CO2 28 22 - 32 mmol/L   Glucose, Bld 71 70 - 99 mg/dL    Comment: Glucose reference range applies only to samples taken after fasting for at least 8 hours.   BUN 10 6 - 20 mg/dL   Creatinine 9.23 9.55 - 1.00 mg/dL   Calcium 9.6 8.9 - 89.6 mg/dL   Total Protein 7.5 6.5 - 8.1 g/dL   Albumin 4.6 3.5 - 5.0 g/dL   AST 18 15 - 41 U/L   ALT 10 0 - 44 U/L   Alkaline Phosphatase 50 38 - 126 U/L   Total Bilirubin 0.7 0.0 - 1.2 mg/dL   GFR, Estimated >39 >39 mL/min    Comment: (NOTE) Calculated using the CKD-EPI Creatinine Equation (2021)    Anion gap 9 5 - 15    Comment: Performed at [facility], [address] [place], [place] [address]  CBC with Differential (Cancer Center Only)     Status: Abnormal   Collection Time: 02/23/24  3:40 PM  Result Value Ref Range   WBC Count 5.4 4.0 - 10.5 K/uL   RBC 3.73 (L) 3.87 - 5.11 MIL/uL   Hemoglobin 10.5 (L) 12.0 - 15.0 g/dL   HCT 67.5 (L) 63.9 - 53.9 %   MCV 86.9 80.0 - 100.0 fL   MCH 28.2 26.0 - 34.0 pg   MCHC 32.4 30.0 - 36.0 g/dL   RDW 87.0 88.4 - 84.4 %   Platelet Count 283 150 - 400 K/uL   nRBC 0.0 0.0 - 0.2 %   Neutrophils Relative % 55 %   Neutro Abs 3.0 1.7 - 7.7 K/uL   Lymphocytes Relative 34 %   Lymphs Abs 1.8 0.7 - 4.0 K/uL   Monocytes Relative 7 %   Monocytes Absolute 0.4 0.1 - 1.0 K/uL   Eosinophils Relative 3 %   Eosinophils Absolute 0.2 0.0 - 0.5 K/uL   Basophils Relative 1 %   Basophils Absolute 0.0 0.0 - 0.1 K/uL   Immature Granulocytes 0 %   Abs Immature Granulocytes 0.01 0.00 - 0.07 K/uL    Comment: Performed at [facility], [address] [place], [place] [address]      RADIOGRAPHIC STUDIES:  No pertinent imaging studies available to review.  Orders Placed This Encounter  Procedures   CBC with Differential (Cancer Center Only)    Standing Status:   Future     Expiration Date:   03/07/2025   Iron and TIBC    Standing Status:   Future    Expiration Date:   03/07/2025   Ferritin    Standing Status:   Future    Expiration Date:   03/07/2025    Future Appointments  Date Time Provider Department Center  03/10/2024  9:00 AM [facility] INFUSION [facility] None  03/21/2024  2:00 PM [facility]  05/12/2024 11:00 AM Allwardt, Mardy HERO, PA-C [facility] [place]    I spent a total of 55 minutes during this encounter with the patient including review of chart and various tests results, discussions about plan of care and coordination of care plan.  This document was completed utilizing speech recognition software. Grammatical errors, random word insertions, pronoun errors, and incomplete sentences are an occasional consequence of this system due to software limitations, ambient noise, and hardware issues. Any formal questions or concerns about the content, text or information contained within the body of this dictation should be directly addressed to the provider for clarification.

## 2024-03-07 NOTE — Assessment & Plan Note (Signed)
 Chronic iron deficiency anemia likely secondary to heavy menstrual bleeding.   Hemoglobin levels fluctuate between 10.5 and 12.7, with a recent level of 10.5. Iron saturation is low at 8%, and ferritin levels are consistently low, indicating depleted iron stores.  Oral iron supplements were not tolerated due to gastrointestinal side effects. B12, folic acid, and ANA tests are normal, ruling out other causes of anemia.   Discussed potential interventions with OBGYN to manage heavy menstrual bleeding, including birth control, IUD, ablation, embolization, or hysterectomy.   IV iron infusion is planned to improve iron levels and energy.   We get approval to proceed with Venofer.  Will plan for weekly x 5 doses.  - Coordinate with OBGYN for potential interventions to manage heavy menstrual bleeding. - Will schedule follow-up appointment in five months to reassess iron levels and treatment efficacy.

## 2024-03-08 ENCOUNTER — Telehealth: Payer: Self-pay | Admitting: Oncology

## 2024-03-08 NOTE — Telephone Encounter (Signed)
 Patient has been scheduled for follow-up visit per 03/07/24 LOS.  LVM notifying pt of appt details, provided my direct number to pt if appt changes need to be made.

## 2024-03-10 ENCOUNTER — Inpatient Hospital Stay

## 2024-03-17 ENCOUNTER — Inpatient Hospital Stay

## 2024-03-17 VITALS — BP 99/59 | HR 77 | Temp 98.3°F | Resp 18

## 2024-03-17 DIAGNOSIS — N920 Excessive and frequent menstruation with regular cycle: Secondary | ICD-10-CM | POA: Diagnosis not present

## 2024-03-17 DIAGNOSIS — Z800 Family history of malignant neoplasm of digestive organs: Secondary | ICD-10-CM | POA: Diagnosis not present

## 2024-03-17 DIAGNOSIS — D500 Iron deficiency anemia secondary to blood loss (chronic): Secondary | ICD-10-CM

## 2024-03-17 DIAGNOSIS — D72819 Decreased white blood cell count, unspecified: Secondary | ICD-10-CM | POA: Diagnosis not present

## 2024-03-17 MED ORDER — IRON SUCROSE 20 MG/ML IV SOLN
200.0000 mg | Freq: Once | INTRAVENOUS | Status: AC
  Administered 2024-03-17: 200 mg via INTRAVENOUS
  Filled 2024-03-17: qty 10

## 2024-03-17 MED ORDER — SODIUM CHLORIDE 0.9 % IV SOLN: INTRAVENOUS | Status: DC

## 2024-03-17 MED ORDER — ACETAMINOPHEN 325 MG PO TABS
650.0000 mg | ORAL_TABLET | Freq: Once | ORAL | Status: AC
  Administered 2024-03-17: 650 mg via ORAL
  Filled 2024-03-17: qty 2

## 2024-03-17 MED ORDER — DIPHENHYDRAMINE HCL 25 MG PO CAPS
25.0000 mg | ORAL_CAPSULE | Freq: Once | ORAL | Status: AC
  Administered 2024-03-17: 25 mg via ORAL
  Filled 2024-03-17: qty 1

## 2024-03-17 NOTE — Patient Instructions (Signed)

## 2024-03-21 ENCOUNTER — Ambulatory Visit
Admission: RE | Admit: 2024-03-21 | Discharge: 2024-03-21 | Disposition: A | Source: Ambulatory Visit | Attending: Interventional Radiology | Admitting: Interventional Radiology

## 2024-03-21 DIAGNOSIS — R102 Pelvic and perineal pain unspecified side: Secondary | ICD-10-CM | POA: Diagnosis not present

## 2024-03-21 DIAGNOSIS — G8929 Other chronic pain: Secondary | ICD-10-CM | POA: Diagnosis not present

## 2024-03-21 DIAGNOSIS — Z9889 Other specified postprocedural states: Secondary | ICD-10-CM | POA: Diagnosis not present

## 2024-03-21 HISTORY — PX: IR RADIOLOGIST EVAL & MGMT: IMG5224

## 2024-03-21 NOTE — Progress Notes (Signed)
 This encounter was conducted via the [employer] providing interactive audio and visual communication.  The patient provided verbal consent to conduct a virtual appointment.  The patient was located at their primary residence during this encounter.   Chief Complaint: Patient was seen in consultation today for bilateral ovarian venous insufficiency and chronic pelvic pain at the request of Dhairya Corales K  Referring Physician(s): Brodyn Depuy K  History of Present Illness: Michele Arias is a 38 y.o. female with a history of chronic pelvic pain due to ovarian venous insufficiency which manifested as symptoms of pelvic pain, heaviness, pressure, dyspareunia and bilateral lower extremity pain which is worse after long periods of standing and activity. Her symptoms were quite severe and affecting her quality of life.    She underwent bilateral ovarian venography pole on 08/02/2023 which confirmed retrograde flow in both ovarian veins. The veins were subsequently embolized during same session.    Excellent initial result for the first 6 months with near total resolution of her pain.  However, she began to have recurrent but less frequent symptoms at the 9-month mark.  She recently had a CT venogram to assess her vasculature and the anatomic success of her procedure.  The CT venogram demonstrates successful embolization of both ovarian veins without evidence of recurrent venous insufficiency.  The parametrial veins were significantly less distended compared to her prior imaging.  There was no other acute or significant abnormality within the abdomen or pelvis.  We spoke over video chat again today.  She is pleased to report that her symptoms have calm down and are less significant than they were at our last appointment.  She has a follow-up appointment with her OB/GYN later this month.  She was very pleased to hear that the procedure appears successful anatomically and I see no need  for repeat venography or further embolization at this time.  Past Medical History:  Diagnosis Date   Anemia    Anxiety    Depression    Medical history non-contributory    Pelvic congestion    UTI (urinary tract infection)     Past Surgical History:  Procedure Laterality Date   IR EMBO VENOUS NOT HEMORR HEMANG  INC GUIDE ROADMAPPING  08/02/2023   IR RADIOLOGIST EVAL & MGMT  06/24/2023   IR RADIOLOGIST EVAL & MGMT  08/13/2023   IR RADIOLOGIST EVAL & MGMT  01/27/2024   NO PAST SURGERIES      Allergies: Patient has no known allergies.  Medications: Prior to Admission medications   Medication Sig Start Date End Date Taking? Authorizing Provider  hydrOXYzine  (VISTARIL ) 25 MG capsule Take 1 capsule (25 mg total) by mouth every 8 (eight) hours as needed for anxiety. 01/12/24   Allwardt, Mardy HERO, PA-C     Family History  Problem Relation Age of Onset   Alcohol abuse Mother    Depression Mother    Drug abuse Mother    Hypertension Mother    Mental illness Mother    Bipolar disorder Mother    Anxiety disorder Mother    Alcohol abuse Father    Drug abuse Sister    Mental illness Sister    Miscarriages / Stillbirths Sister    Alcohol abuse Brother    Depression Brother    Mental illness Brother    Alcohol abuse Brother    Drug abuse Brother    Breast cancer Maternal Grandmother        ? age of diagnosis   Hypertension Maternal Grandmother  Stroke Maternal Grandmother    Cancer Maternal Grandmother    Cancer Paternal Grandmother    Colon cancer Maternal Uncle 40 - 49   Hearing loss Maternal Uncle    Esophageal cancer Neg Hx    Rectal cancer Neg Hx    Stomach cancer Neg Hx     Social History   Socioeconomic History   Marital status: Married    Spouse name: Not on file   Number of children: Not on file   Years of education: Not on file   Highest education level: Not on file  Occupational History   Not on file  Tobacco Use   Smoking status: Never   Smokeless  tobacco: Never  Vaping Use   Vaping status: Never Used  Substance and Sexual Activity   Alcohol use: Yes    Comment: rarely   Drug use: No   Sexual activity: Not Currently    Partners: Male    Birth control/protection: Surgical    Comment: husband-vasectomy  Other Topics Concern   Not on file  Social History Narrative   Left handed   Lives with husband and to children in a two story   Caffeine rarely   Social Drivers of Corporate Investment Banker Strain: Not on file  Food Insecurity: No Food Insecurity (03/07/2024)   Hunger Vital Sign    Worried About Running Out of Food in the Last Year: Never true    Ran Out of Food in the Last Year: Never true  Transportation Needs: No Transportation Needs (03/07/2024)   PRAPARE - Administrator, Civil Service (Medical): No    Lack of Transportation (Non-Medical): No  Physical Activity: Not on file  Stress: Not on file  Social Connections: Not on file    Review of Systems: A 12 point ROS discussed and pertinent positives are indicated in the HPI above.  All other systems are negative.  Review of Systems  Vital Signs: There were no vitals taken for this visit.    Physical Exam Constitutional:      General: She is not in acute distress.    Appearance: Normal appearance. She is normal weight.  HENT:     Head: Normocephalic and atraumatic.  Eyes:     General: No scleral icterus. Pulmonary:     Effort: Pulmonary effort is normal.  Abdominal:     Tenderness: There is no abdominal tenderness. There is no guarding.  Neurological:     Mental Status: She is alert and oriented to person, place, and time.  Psychiatric:        Mood and Affect: Mood normal.        Behavior: Behavior normal.       Imaging: CT VENOGRAM ABD/PEL Result Date: 03/11/2024 CLINICAL DATA:  Pelvic pain status post bilateral ovarian vein embolization for venous insufficiency/pelvic congestion syndrome EXAM: CT VENOGRAM ABDOMEN AND PELVIS  TECHNIQUE: Multiplanar images of the abdomen and pelvis were obtained following the minute stray shin of intravenous contrast. Images were obtained during the venous phase. Multiplanar CT image reconstructions and MIPs were obtained to evaluate the vascular anatomy. RADIATION DOSE REDUCTION: This exam was performed according to the departmental dose-optimization program which includes automated exposure control, adjustment of the mA and/or kV according to patient size and/or use of iterative reconstruction technique. CONTRAST:  100mL ISOVUE -370 IOPAMIDOL  (ISOVUE -370) INJECTION 76% COMPARISON:  None Available. FINDINGS: Lower chest: No acute abnormality. Hepatobiliary: Similar scattered small numerous hepatic hypodensities, stable in number and distribution favored  to be small benign cysts. No other significant hepatic abnormality. No biliary dilatation or obstruction. Hepatic and portal veins are patent. Gallbladder nondistended. Common bile duct nondilated. Pancreas: Unremarkable. No pancreatic ductal dilatation or surrounding inflammatory changes. Spleen: Normal in size without focal abnormality. Adrenals/Urinary Tract: Adrenal glands are unremarkable. Kidneys are normal, without renal calculi, focal lesion, or hydronephrosis. Bladder is unremarkable. Stomach/Bowel: Stomach is within normal limits. Appendix appears normal. No evidence of bowel wall thickening, distention, or inflammatory changes. Arterial/lymphatic: No significant vascular findings are present. No enlarged abdominal or pelvic lymph nodes. Reproductive: Uterus and bilateral adnexa are unremarkable. Other: No abdominal wall hernia or abnormality. No inguinal abnormality. Trace pelvic free fluid posteriorly, physiologic. Musculoskeletal: No acute osseous finding. Bilateral L5 pars defects noted without associated anterolisthesis. Preserved vertebral body heights and disc spaces. IVC: No evidence for thrombus or stenosis. Portal and mesenteric veins:  No evidence for thrombus or stenosis. Bilateral iliac veins: No evidence for thrombus or stenosis. Ovarian veins: Patient is status post proximal and distal embolization of both ovarian veins. Residual ovarian veins are now thread-like in caliber when compared to the prior study following the embolization. Review of the MIP images confirms the above findings. IMPRESSION: 1. Status post bilateral ovarian vein embolization. Residual ovarian veins are now thread-like in caliber when compared to the prior study following the successful embolization. 2. No other acute intra-abdominal or pelvic finding. 3. Bilateral L5 pars defects without associated anterolisthesis. Electronically Signed   By: CHRISTELLA.  Shick M.D.   On: 03/11/2024 21:33    Labs:  CBC: Recent Labs    08/02/23 0805 09/27/23 0834 01/20/24 0828 02/23/24 1540  WBC 5.2 2.6* 2.9* 5.4  HGB 11.9* 11.2* 10.6* 10.5*  HCT 35.7* 32.9* 31.9* 32.4*  PLT 300 299.0 261.0 283    COAGS: Recent Labs    08/02/23 0805  INR 1.0    BMP: Recent Labs    03/29/23 1031 08/02/23 0805 02/23/24 1540  NA 138 139 142  K 3.6 3.4* 3.4*  CL 106 106 105  CO2 26 24 28   GLUCOSE 73 94 71  BUN 10 15 10   CALCIUM 8.9 9.0 9.6  CREATININE 0.72 0.67 0.76  GFRNONAA  --  >60 >60    LIVER FUNCTION TESTS: Recent Labs    03/29/23 1031 02/23/24 1540  BILITOT 0.7 0.7  AST 15 18  ALT 11 10  ALKPHOS 36* 50  PROT 7.0 7.5  ALBUMIN 4.1 4.6    TUMOR MARKERS: No results for input(s): AFPTM, CEA, CA199, CHROMGRNA in the last 8760 hours.  Assessment and Plan:  38 year old female with a history of bile ovarian venous insufficiency and chronic pelvic pain. She underwent bilateral ovarian venography and embolization in April of 2025. Her symptoms are significantly improved but she does have occasional waxing and waning pain in the pelvic region.  Her recent CT venogram demonstrated a successful treatment with no evidence of recurrent venous insufficiency or  other issues.  She was very relieved to hear the good news.  She has a follow-up appointment scheduled with her OB-GYN.     Electronically Signed: Wilkie MARLA Lent 03/21/2024, 2:15 PM   This encounter was conducted via the [employer] providing interactive audio and visual communication.  The patient provided verbal consent to conduct a virtual appointment.  The patient was located at their primary residence during this encounter.   I spent a total of  15 Minutes in face to face in clinical consultation, greater than 50% of which was  counseling/coordinating care for ovarian venous insufficiency

## 2024-03-31 ENCOUNTER — Inpatient Hospital Stay: Attending: Oncology

## 2024-03-31 ENCOUNTER — Encounter: Admitting: Physician Assistant

## 2024-03-31 VITALS — BP 98/54 | HR 78 | Temp 97.9°F | Resp 18

## 2024-03-31 DIAGNOSIS — N920 Excessive and frequent menstruation with regular cycle: Secondary | ICD-10-CM | POA: Insufficient documentation

## 2024-03-31 DIAGNOSIS — D72819 Decreased white blood cell count, unspecified: Secondary | ICD-10-CM | POA: Insufficient documentation

## 2024-03-31 DIAGNOSIS — D500 Iron deficiency anemia secondary to blood loss (chronic): Secondary | ICD-10-CM | POA: Insufficient documentation

## 2024-03-31 MED ORDER — IRON SUCROSE 20 MG/ML IV SOLN
200.0000 mg | Freq: Once | INTRAVENOUS | Status: AC
  Administered 2024-03-31: 200 mg via INTRAVENOUS
  Filled 2024-03-31: qty 10

## 2024-03-31 MED ORDER — SODIUM CHLORIDE 0.9 % IV SOLN: INTRAVENOUS | Status: DC

## 2024-03-31 MED ORDER — DIPHENHYDRAMINE HCL 25 MG PO CAPS
25.0000 mg | ORAL_CAPSULE | Freq: Once | ORAL | Status: AC
  Administered 2024-03-31: 25 mg via ORAL
  Filled 2024-03-31: qty 1

## 2024-03-31 MED ORDER — ACETAMINOPHEN 325 MG PO TABS
650.0000 mg | ORAL_TABLET | Freq: Once | ORAL | Status: AC
  Administered 2024-03-31: 650 mg via ORAL
  Filled 2024-03-31: qty 2

## 2024-03-31 NOTE — Patient Instructions (Signed)

## 2024-04-07 ENCOUNTER — Inpatient Hospital Stay

## 2024-04-07 VITALS — BP 106/70 | HR 80 | Temp 98.0°F | Resp 18

## 2024-04-07 DIAGNOSIS — D500 Iron deficiency anemia secondary to blood loss (chronic): Secondary | ICD-10-CM | POA: Diagnosis not present

## 2024-04-07 MED ORDER — SODIUM CHLORIDE 0.9 % IV SOLN: INTRAVENOUS | Status: DC

## 2024-04-07 MED ORDER — DIPHENHYDRAMINE HCL 25 MG PO CAPS
25.0000 mg | ORAL_CAPSULE | Freq: Once | ORAL | Status: AC
  Administered 2024-04-07: 25 mg via ORAL
  Filled 2024-04-07: qty 1

## 2024-04-07 MED ORDER — IRON SUCROSE 20 MG/ML IV SOLN
200.0000 mg | Freq: Once | INTRAVENOUS | Status: AC
  Administered 2024-04-07: 200 mg via INTRAVENOUS
  Filled 2024-04-07: qty 10

## 2024-04-07 MED ORDER — ACETAMINOPHEN 325 MG PO TABS
650.0000 mg | ORAL_TABLET | Freq: Once | ORAL | Status: AC
  Administered 2024-04-07: 650 mg via ORAL
  Filled 2024-04-07: qty 2

## 2024-04-07 NOTE — Patient Instructions (Signed)

## 2024-04-14 ENCOUNTER — Inpatient Hospital Stay

## 2024-04-28 ENCOUNTER — Inpatient Hospital Stay: Payer: Self-pay | Attending: Oncology

## 2024-04-28 VITALS — BP 123/68 | HR 84 | Temp 97.6°F | Resp 16

## 2024-04-28 DIAGNOSIS — D72819 Decreased white blood cell count, unspecified: Secondary | ICD-10-CM | POA: Insufficient documentation

## 2024-04-28 DIAGNOSIS — D500 Iron deficiency anemia secondary to blood loss (chronic): Secondary | ICD-10-CM | POA: Insufficient documentation

## 2024-04-28 DIAGNOSIS — N920 Excessive and frequent menstruation with regular cycle: Secondary | ICD-10-CM | POA: Diagnosis present

## 2024-04-28 MED ORDER — DIPHENHYDRAMINE HCL 25 MG PO CAPS
25.0000 mg | ORAL_CAPSULE | Freq: Once | ORAL | Status: AC
  Administered 2024-04-28: 25 mg via ORAL
  Filled 2024-04-28: qty 1

## 2024-04-28 MED ORDER — ACETAMINOPHEN 325 MG PO TABS
650.0000 mg | ORAL_TABLET | Freq: Once | ORAL | Status: AC
  Administered 2024-04-28: 650 mg via ORAL
  Filled 2024-04-28: qty 2

## 2024-04-28 MED ORDER — IRON SUCROSE 20 MG/ML IV SOLN
200.0000 mg | Freq: Once | INTRAVENOUS | Status: AC
  Administered 2024-04-28: 200 mg via INTRAVENOUS
  Filled 2024-04-28: qty 10

## 2024-04-28 MED ORDER — SODIUM CHLORIDE 0.9 % IV SOLN: INTRAVENOUS | Status: DC

## 2024-04-28 NOTE — Patient Instructions (Signed)

## 2024-05-06 ENCOUNTER — Encounter: Payer: Self-pay | Admitting: Oncology

## 2024-05-12 ENCOUNTER — Encounter: Payer: Self-pay | Admitting: Oncology

## 2024-05-12 ENCOUNTER — Encounter: Payer: Self-pay | Admitting: Physician Assistant

## 2024-05-12 ENCOUNTER — Ambulatory Visit (INDEPENDENT_AMBULATORY_CARE_PROVIDER_SITE_OTHER): Payer: Self-pay | Admitting: Physician Assistant

## 2024-05-12 VITALS — BP 100/62 | HR 99 | Temp 97.9°F | Ht 63.98 in | Wt 126.6 lb

## 2024-05-12 DIAGNOSIS — Z0000 Encounter for general adult medical examination without abnormal findings: Secondary | ICD-10-CM

## 2024-05-12 DIAGNOSIS — N946 Dysmenorrhea, unspecified: Secondary | ICD-10-CM

## 2024-05-12 DIAGNOSIS — Z13220 Encounter for screening for lipoid disorders: Secondary | ICD-10-CM | POA: Diagnosis not present

## 2024-05-12 DIAGNOSIS — Z0001 Encounter for general adult medical examination with abnormal findings: Secondary | ICD-10-CM | POA: Diagnosis not present

## 2024-05-12 DIAGNOSIS — R002 Palpitations: Secondary | ICD-10-CM | POA: Diagnosis not present

## 2024-05-12 DIAGNOSIS — D508 Other iron deficiency anemias: Secondary | ICD-10-CM | POA: Diagnosis not present

## 2024-05-12 DIAGNOSIS — Z131 Encounter for screening for diabetes mellitus: Secondary | ICD-10-CM

## 2024-05-12 LAB — IBC + FERRITIN
Ferritin: 55 ng/mL (ref 10.0–291.0)
Iron: 54 ug/dL (ref 42–145)
Saturation Ratios: 16.3 % — ABNORMAL LOW (ref 20.0–50.0)
TIBC: 331.8 ug/dL (ref 250.0–450.0)
Transferrin: 237 mg/dL (ref 212.0–360.0)

## 2024-05-12 LAB — CBC WITH DIFFERENTIAL/PLATELET
Basophils Absolute: 0 K/uL (ref 0.0–0.1)
Basophils Relative: 0.6 % (ref 0.0–3.0)
Eosinophils Absolute: 0.2 K/uL (ref 0.0–0.7)
Eosinophils Relative: 4 % (ref 0.0–5.0)
HCT: 35.2 % — ABNORMAL LOW (ref 36.0–46.0)
Hemoglobin: 12.1 g/dL (ref 12.0–15.0)
Lymphocytes Relative: 32.5 % (ref 12.0–46.0)
Lymphs Abs: 1.3 K/uL (ref 0.7–4.0)
MCHC: 34.3 g/dL (ref 30.0–36.0)
MCV: 88.5 fl (ref 78.0–100.0)
Monocytes Absolute: 0.3 K/uL (ref 0.1–1.0)
Monocytes Relative: 7.3 % (ref 3.0–12.0)
Neutro Abs: 2.2 K/uL (ref 1.4–7.7)
Neutrophils Relative %: 55.6 % (ref 43.0–77.0)
Platelets: 252 K/uL (ref 150.0–400.0)
RBC: 3.98 Mil/uL (ref 3.87–5.11)
RDW: 15.7 % — ABNORMAL HIGH (ref 11.5–15.5)
WBC: 3.9 K/uL — ABNORMAL LOW (ref 4.0–10.5)

## 2024-05-12 LAB — COMPREHENSIVE METABOLIC PANEL WITH GFR
ALT: 9 U/L (ref 3–35)
AST: 10 U/L (ref 5–37)
Albumin: 4.2 g/dL (ref 3.5–5.2)
Alkaline Phosphatase: 36 U/L — ABNORMAL LOW (ref 39–117)
BUN: 12 mg/dL (ref 6–23)
CO2: 29 meq/L (ref 19–32)
Calcium: 9.1 mg/dL (ref 8.4–10.5)
Chloride: 104 meq/L (ref 96–112)
Creatinine, Ser: 0.62 mg/dL (ref 0.40–1.20)
GFR: 112.92 mL/min
Glucose, Bld: 76 mg/dL (ref 70–99)
Potassium: 4 meq/L (ref 3.5–5.1)
Sodium: 140 meq/L (ref 135–145)
Total Bilirubin: 0.6 mg/dL (ref 0.2–1.2)
Total Protein: 6.7 g/dL (ref 6.0–8.3)

## 2024-05-12 LAB — LIPID PANEL
Cholesterol: 128 mg/dL (ref 28–200)
HDL: 45.1 mg/dL
LDL Cholesterol: 67 mg/dL (ref 10–99)
NonHDL: 82.8
Total CHOL/HDL Ratio: 3
Triglycerides: 79 mg/dL (ref 10.0–149.0)
VLDL: 15.8 mg/dL (ref 0.0–40.0)

## 2024-05-12 LAB — TSH: TSH: 1.1 u[IU]/mL (ref 0.35–5.50)

## 2024-05-12 LAB — HEMOGLOBIN A1C: Hgb A1c MFr Bld: 5.2 % (ref 4.6–6.5)

## 2024-05-12 NOTE — Progress Notes (Signed)
 "   Patient ID: Michele Arias, female    DOB: November 22, 1985, 39 y.o.   MRN: [id number]   Assessment & Plan:  Annual physical exam -     CBC with Differential/Platelet -     Comprehensive metabolic panel with GFR -     Hemoglobin A1c -     Lipid panel -     TSH  Dysmenorrhea -     CBC with Differential/Platelet -     IBC + Ferritin  Other iron  deficiency anemia  Palpitations     Assessment & Plan Annual physical exam Routine annual physical exam with well-managed weight and blood pressure. Reports poor nutrition and decreased appetite, potentially contributing to dizziness and fatigue. - Ordered CBC, CMP, A1c, ferritin, lipid panel, and TSH - Advised on maintaining a balanced diet and monitoring caffeine intake - Encouraged hydration and iron -rich diet  Iron  deficiency anemia Chronic iron  deficiency anemia with low ferritin levels, likely exacerbated by abnormal uterine bleeding. Reports dizziness and fatigue, possibly due to low iron  levels. Undergoing iron  infusions with plans for one more session. - Ordered ferritin level - Encouraged completion of iron  infusion series - Advised on iron -rich diet  Abnormal uterine bleeding with dysmenorrhea Chronic abnormal uterine bleeding with dysmenorrhea, heavy and painful periods, and spotting. Previous ultrasound and ovarian vein embolization performed. Considering switching OB GYN due to lack of improvement. Discussed potential interventions including D&C or hysterectomy. Hesitant about birth control due to past adverse effects. - Advised contacting OB GYN for follow-up or referral to another provider - Discussed potential interventions such as D&C or hysterectomy - Encouraged discussion with husband regarding treatment options  Palpitations Intermittent palpitations with associated chest pain, increasing in frequency and causing anxiety. Previous heart monitor in 2023 showed no significant arrhythmias. Possible allergic reaction to  heart monitor tape. Anemia may contribute to palpitations. - Ordered CBC and CMP - Advised on monitoring caffeine intake and hydration - Discussed potential referral to cardiologist for further evaluation   Age-appropriate screening and counseling performed today. Will check labs and call with results. Preventive measures discussed and printed in AVS for patient.   Patient Counseling: [x]   Nutrition: Stressed importance of moderation in sodium/caffeine intake, saturated fat and cholesterol, caloric balance, sufficient intake of fresh fruits, vegetables, and fiber.  [x]   Stressed the importance of regular exercise.   [x]   Substance Abuse: Discussed cessation/primary prevention of tobacco, alcohol, or other drug use; driving or other dangerous activities under the influence; availability of treatment for abuse.   []   Injury prevention: Discussed safety belts, safety helmets, smoke detector, smoking near bedding or upholstery.   []   Sexuality: Discussed sexually transmitted diseases, partner selection, use of condoms, avoidance of unintended pregnancy  and contraceptive alternatives.   [x]   Dental health: Discussed importance of regular tooth brushing, flossing, and dental visits.  [x]   Health maintenance and immunizations reviewed. Please refer to Health maintenance section.       Return in about 1 year (around 05/12/2025) for physical.    Subjective:    Chief Complaint  Patient presents with   Annual Exam    Pt in office for annual CPE and labs; pt still having dizziness and abnormal heartbeat but no other changes to health since last CPE; pt is not fasting for labs;     HPI Discussed the use of AI scribe software for clinical note transcription with the patient, who gave verbal consent to proceed.  History of Present Illness Michele Arias is a 39  year old female who presents for an annual physical exam.  She has a history of iron  deficiency anemia, managed with iron  infusions.  She has completed four infusions and plans to return for one more. Ferritin levels remain low, and she experiences heavy and painful menstrual periods. Spotting occurs up to thirteen days before her cycle begins. An ultrasound revealed a vein issue, leading to ovarian vein embolization in November, which improved some symptoms, though uterine pain persists.  She experiences heart palpitations and occasional chest pain, which have increased in frequency and cause anxiety. A heart monitor was used in 2023, but she had to discontinue it due to an allergic reaction to the tape. She is concerned about the palpitations being related to her anemia.  She reports poor nutrition, often skipping meals due to lack of appetite or being too busy. She sometimes forces herself to eat, such as a peanut butter jelly sandwich before bed. She experiences dizzy spells, which she suspects might be related to low blood sugar.  She continues to have regular menstrual periods, which are painful and heavy. She has discussed potential treatments with her gynecologist, including a procedure to reduce uterine lining shedding, but she is hesitant about birth control due to past negative experiences. She is considering switching gynecologists due to dissatisfaction with her current care.  She leads a busy lifestyle with two children aged fourteen and nine, one involved in travel soccer and the other in gymnastics. She works and has little time for herself, often feeling exhausted. 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      Past Medical History:  Diagnosis Date   Anemia    Anxiety    Depression    Dizziness    Medical history non-contributory    Pelvic congestion    UTI (urinary tract infection)     Past Surgical History:  Procedure Laterality Date   IR EMBO VENOUS NOT HEMORR HEMANG  INC GUIDE ROADMAPPING  08/02/2023   IR RADIOLOGIST EVAL & MGMT  06/24/2023   IR  RADIOLOGIST EVAL & MGMT  08/13/2023   IR RADIOLOGIST EVAL & MGMT  01/27/2024   IR RADIOLOGIST EVAL & MGMT  03/21/2024   NO PAST SURGERIES      Family History  Problem Relation Age of Onset   Alcohol abuse Mother    Depression Mother    Drug abuse Mother    Hypertension Mother    Mental illness Mother    Bipolar disorder Mother    Anxiety disorder Mother    Alcohol abuse Father    Drug abuse Sister    Mental illness Sister    Miscarriages / Stillbirths Sister    Alcohol abuse Brother    Depression Brother    Mental illness Brother    Alcohol abuse Brother    Drug abuse Brother    Breast cancer Maternal Grandmother        ? age of diagnosis   Hypertension Maternal Grandmother    Stroke Maternal Grandmother    Cancer Maternal Grandmother    Cancer Paternal Grandmother    Colon cancer Maternal Uncle 40 - 49   Hearing loss Maternal Uncle    Esophageal cancer Neg Hx    Rectal cancer Neg Hx    Stomach cancer Neg Hx     Social History[1]   Allergies[2]  Review of Systems NEGATIVE UNLESS OTHERWISE INDICATED IN HPI      Objective:  BP 100/62 (BP Location: Left Arm, Patient Position: Sitting, Cuff Size: Normal)   Pulse 99   Temp 97.9 F (36.6 C) (Temporal)   Ht 5' 3.98 (1.625 m)   Wt 126 lb 9.6 oz (57.4 kg)   LMP 05/10/2024 (Exact Date)   SpO2 99%   BMI 21.75 kg/m   Wt Readings from Last 3 Encounters:  05/12/24 126 lb 9.6 oz (57.4 kg)  03/07/24 131 lb 1.6 oz (59.5 kg)  10/28/23 127 lb 9.6 oz (57.9 kg)    BP Readings from Last 3 Encounters:  05/12/24 100/62  04/28/24 123/68  04/07/24 106/70     Physical Exam Vitals and nursing note reviewed.  Constitutional:      Appearance: Normal appearance. She is normal weight. She is not toxic-appearing.  HENT:     Head: Normocephalic and atraumatic.     Right Ear: Tympanic membrane, ear canal and external ear normal.     Left Ear: Tympanic membrane, ear canal and external ear normal.     Nose: Nose normal.      Mouth/Throat:     Mouth: Mucous membranes are moist.  Eyes:     Extraocular Movements: Extraocular movements intact.     Conjunctiva/sclera: Conjunctivae normal.     Pupils: Pupils are equal, round, and reactive to light.  Neck:     Thyroid : No thyroid  mass, thyromegaly or thyroid  tenderness.  Cardiovascular:     Rate and Rhythm: Normal rate and regular rhythm.     Pulses: Normal pulses.     Heart sounds: Normal heart sounds.  Pulmonary:     Effort: Pulmonary effort is normal.     Breath sounds: Normal breath sounds.  Abdominal:     General: Abdomen is flat. Bowel sounds are normal.     Palpations: Abdomen is soft.  Musculoskeletal:        General: Normal range of motion.     Cervical back: Normal range of motion and neck supple.  Lymphadenopathy:     Cervical: No cervical adenopathy.  Skin:    General: Skin is warm and dry.  Neurological:     General: No focal deficit present.     Mental Status: She is alert and oriented to person, place, and time.  Psychiatric:        Mood and Affect: Mood normal.        Behavior: Behavior normal.        Thought Content: Thought content normal.        Judgment: Judgment normal.             Michele Tessler M Elige Shouse, PA-C    [1]  Social History Tobacco Use   Smoking status: Never   Smokeless tobacco: Never  Vaping Use   Vaping status: Never Used  Substance Use Topics   Alcohol use: Yes    Comment: rarely   Drug use: No  [2] No Known Allergies  "

## 2024-05-18 ENCOUNTER — Ambulatory Visit: Payer: Self-pay | Admitting: Physician Assistant

## 2024-05-25 ENCOUNTER — Telehealth: Payer: Self-pay

## 2024-05-25 NOTE — Telephone Encounter (Signed)
 Called pt and lvm requesting more information since this wasn't noted during OV with PCP, unsure if this needs to be sent to her GYN. Advised pt to return call at [phone]

## 2024-05-25 NOTE — Telephone Encounter (Signed)
 Copied from CRM [id number]. Topic: Clinical - Request for Lab/Test Order >> May 24, 2024  2:23 PM Alfonso ORN wrote: Reason for CRM: breast scan requested for [facility] as she has had a lump past 5 days as directed during appt feb 2025  is trying to schedule . Please advise [phone] , voicemail requested if no response  Please see request from [facility] and advise

## 2024-05-26 ENCOUNTER — Encounter: Payer: Self-pay | Admitting: Physician Assistant

## 2024-05-26 ENCOUNTER — Ambulatory Visit: Admitting: Physician Assistant

## 2024-05-26 DIAGNOSIS — N6011 Diffuse cystic mastopathy of right breast: Secondary | ICD-10-CM | POA: Diagnosis not present

## 2024-05-26 DIAGNOSIS — N63 Unspecified lump in unspecified breast: Secondary | ICD-10-CM | POA: Diagnosis not present

## 2024-05-26 DIAGNOSIS — N644 Mastodynia: Secondary | ICD-10-CM | POA: Diagnosis not present

## 2024-05-26 NOTE — Progress Notes (Signed)
 "  History of Present Illness:   Chief Complaint  Patient presents with   Breast Mass    Pt c/o lump in right breast, found it a week ago, no discharge from nipple, tenderness.    Discussed the use of AI scribe software for clinical note transcription with the patient, who gave verbal consent to proceed.  History of Present Illness   Michele Arias is a 39 year old female who presents with a new lump in her right breast.  She noticed the new right breast lump about one week ago. She has known breast cysts and cyclic breast pain, and is mid-cycle with her usual tenderness.  She also notes a prior lump in her left breast first found around February of last year at the two o'clock position and still feels some residual presence there.  She has no nipple discharge, no new changes in the axilla, and no other new breast symptoms apart from her usual tenderness.  Her maternal grandmother had breast cancer, age at diagnosis unknown.        Past Medical History:  Diagnosis Date   Anemia    Anxiety    Depression    Dizziness    Medical history non-contributory    Pelvic congestion    UTI (urinary tract infection)      Social History[1]  Past Surgical History:  Procedure Laterality Date   IR EMBO VENOUS NOT HEMORR HEMANG  INC GUIDE ROADMAPPING  08/02/2023   IR RADIOLOGIST EVAL & MGMT  06/24/2023   IR RADIOLOGIST EVAL & MGMT  08/13/2023   IR RADIOLOGIST EVAL & MGMT  01/27/2024   IR RADIOLOGIST EVAL & MGMT  03/21/2024   NO PAST SURGERIES      Family History  Problem Relation Age of Onset   Alcohol abuse Mother    Depression Mother    Drug abuse Mother    Hypertension Mother    Mental illness Mother    Bipolar disorder Mother    Anxiety disorder Mother    Alcohol abuse Father    Drug abuse Sister    Mental illness Sister    Miscarriages / Stillbirths Sister    Alcohol abuse Brother    Depression Brother    Mental illness Brother    Alcohol abuse Brother    Drug  abuse Brother    Breast cancer Maternal Grandmother        ? age of diagnosis   Hypertension Maternal Grandmother    Stroke Maternal Grandmother    Cancer Maternal Grandmother    Cancer Paternal Grandmother    Colon cancer Maternal Uncle 40 - 49   Hearing loss Maternal Uncle    Esophageal cancer Neg Hx    Rectal cancer Neg Hx    Stomach cancer Neg Hx     Allergies[2]  Current Medications:  Current Medications[3]   Review of Systems:   Negative unless otherwise specified per HPI.  Vitals:   Vitals:   05/26/24 0817  BP: 100/64  Pulse: 72  Temp: 97.9 F (36.6 C)  TempSrc: Temporal  SpO2: 99%  Weight: 130 lb 4 oz (59.1 kg)  Height: 5' 3.75 (1.619 m)     Body mass index is 22.53 kg/m.  Physical Exam:   Physical Exam Constitutional:      Appearance: Normal appearance. She is well-developed.  HENT:     Head: Normocephalic and atraumatic.  Eyes:     General: Lids are normal.     Extraocular Movements: Extraocular movements  intact.     Conjunctiva/sclera: Conjunctivae normal.  Pulmonary:     Effort: Pulmonary effort is normal.  Chest:     Comments: At least two tender cysts palpated in upper outer quadrant of right breast Musculoskeletal:        General: Normal range of motion.     Cervical back: Normal range of motion and neck supple.  Skin:    General: Skin is warm and dry.  Neurological:     Mental Status: She is alert and oriented to person, place, and time.  Psychiatric:        Attention and Perception: Attention and perception normal.        Mood and Affect: Mood normal.        Behavior: Behavior normal.        Thought Content: Thought content normal.        Judgment: Judgment normal.     Assessment and Plan:   Assessment and Plan    Fibrocystic disease of right breast New mobile lump in right breast, likely cystic. Family history of breast cancer noted. Imaging required for further evaluation. - Ordered imaging for right breast cyst. -  Advised her to contact imaging center if not contacted by noon.          Lucie Buttner, PA-C    [1]  Social History Tobacco Use   Smoking status: Never   Smokeless tobacco: Never  Vaping Use   Vaping status: Never Used  Substance Use Topics   Alcohol use: Yes    Comment: rarely   Drug use: No  [2] No Known Allergies [3]  Current Outpatient Medications:    hydrOXYzine  (VISTARIL ) 25 MG capsule, Take 1 capsule (25 mg total) by mouth every 8 (eight) hours as needed for anxiety., Disp: 30 capsule, Rfl: 0  "

## 2024-05-26 NOTE — Telephone Encounter (Signed)
 Pt scheduled to see [facility] 05/26/24

## 2024-06-21 ENCOUNTER — Other Ambulatory Visit

## 2024-06-21 ENCOUNTER — Encounter

## 2024-08-08 ENCOUNTER — Inpatient Hospital Stay: Admitting: Oncology

## 2024-08-08 ENCOUNTER — Inpatient Hospital Stay

## 2025-05-25 ENCOUNTER — Encounter: Admitting: Physician Assistant
